# Patient Record
Sex: Male | Born: 1940 | Race: Black or African American | Hispanic: No | Smoking: Former smoker
Health system: Southern US, Community
[De-identification: ages and names within clinical notes are randomized; demographics above are authoritative.]

## PROBLEM LIST (undated history)

## (undated) DIAGNOSIS — I2510 Atherosclerotic heart disease of native coronary artery without angina pectoris: Secondary | ICD-10-CM

## (undated) DIAGNOSIS — I509 Heart failure, unspecified: Secondary | ICD-10-CM

## (undated) DIAGNOSIS — I10 Essential (primary) hypertension: Secondary | ICD-10-CM

## (undated) DIAGNOSIS — E785 Hyperlipidemia, unspecified: Secondary | ICD-10-CM

## (undated) DIAGNOSIS — E119 Type 2 diabetes mellitus without complications: Secondary | ICD-10-CM

## (undated) DIAGNOSIS — N400 Enlarged prostate without lower urinary tract symptoms: Secondary | ICD-10-CM

## (undated) HISTORY — PX: JOINT REPLACEMENT: SHX530

---

## 2016-05-06 ENCOUNTER — Observation Stay
Admission: EM | Admit: 2016-05-06 | Discharge: 2016-05-08 | Disposition: A | Discharge status: Home / Self Care | Payer: Medicare Other | Attending: Internal Medicine | Admitting: Internal Medicine

## 2016-05-06 ENCOUNTER — Encounter: Payer: Self-pay | Admitting: Occupational Medicine

## 2016-05-06 DIAGNOSIS — Z794 Long term (current) use of insulin: Secondary | ICD-10-CM | POA: Diagnosis not present

## 2016-05-06 DIAGNOSIS — W010XXA Fall on same level from slipping, tripping and stumbling without subsequent striking against object, initial encounter: Secondary | ICD-10-CM | POA: Diagnosis not present

## 2016-05-06 DIAGNOSIS — N179 Acute kidney failure, unspecified: Secondary | ICD-10-CM | POA: Diagnosis not present

## 2016-05-06 DIAGNOSIS — Z7982 Long term (current) use of aspirin: Secondary | ICD-10-CM | POA: Insufficient documentation

## 2016-05-06 DIAGNOSIS — Z9114 Patient's other noncompliance with medication regimen: Secondary | ICD-10-CM | POA: Diagnosis not present

## 2016-05-06 DIAGNOSIS — R739 Hyperglycemia, unspecified: Secondary | ICD-10-CM | POA: Diagnosis present

## 2016-05-06 DIAGNOSIS — Z87891 Personal history of nicotine dependence: Secondary | ICD-10-CM | POA: Insufficient documentation

## 2016-05-06 DIAGNOSIS — E1165 Type 2 diabetes mellitus with hyperglycemia: Principal | ICD-10-CM | POA: Insufficient documentation

## 2016-05-06 DIAGNOSIS — I110 Hypertensive heart disease with heart failure: Secondary | ICD-10-CM | POA: Insufficient documentation

## 2016-05-06 DIAGNOSIS — N390 Urinary tract infection, site not specified: Secondary | ICD-10-CM

## 2016-05-06 DIAGNOSIS — E785 Hyperlipidemia, unspecified: Secondary | ICD-10-CM | POA: Diagnosis not present

## 2016-05-06 DIAGNOSIS — Z79899 Other long term (current) drug therapy: Secondary | ICD-10-CM | POA: Insufficient documentation

## 2016-05-06 DIAGNOSIS — N400 Enlarged prostate without lower urinary tract symptoms: Secondary | ICD-10-CM | POA: Diagnosis not present

## 2016-05-06 DIAGNOSIS — R531 Weakness: Secondary | ICD-10-CM | POA: Insufficient documentation

## 2016-05-06 DIAGNOSIS — W19XXXA Unspecified fall, initial encounter: Secondary | ICD-10-CM

## 2016-05-06 DIAGNOSIS — Z833 Family history of diabetes mellitus: Secondary | ICD-10-CM | POA: Insufficient documentation

## 2016-05-06 HISTORY — DX: Benign prostatic hyperplasia without lower urinary tract symptoms: N40.0

## 2016-05-06 HISTORY — DX: Heart failure, unspecified: I50.9

## 2016-05-06 HISTORY — DX: Hyperlipidemia, unspecified: E78.5

## 2016-05-06 HISTORY — DX: Atherosclerotic heart disease of native coronary artery without angina pectoris: I25.10

## 2016-05-06 HISTORY — DX: Essential (primary) hypertension: I10

## 2016-05-06 HISTORY — DX: Type 2 diabetes mellitus without complications: E11.9

## 2016-05-06 LAB — BASIC METABOLIC PANEL
Anion gap: 11 (ref 5–15)
BUN: 31 mg/dL — AB (ref 6–20)
CALCIUM: 9.6 mg/dL (ref 8.9–10.3)
CHLORIDE: 102 mmol/L (ref 101–111)
CO2: 23 mmol/L (ref 22–32)
CREATININE: 1.57 mg/dL — AB (ref 0.61–1.24)
GFR calc Af Amer: 49 mL/min — ABNORMAL LOW (ref >60)
GFR calc non Af Amer: 42 mL/min — ABNORMAL LOW (ref >60)
Glucose, Bld: 630 mg/dL (ref 65–99)
Potassium: 4.3 mmol/L (ref 3.5–5.1)
SODIUM: 136 mmol/L (ref 135–145)

## 2016-05-06 LAB — CBC WITH DIFFERENTIAL/PLATELET
BASOS PCT: 3 %
Basophils Absolute: 0.1 10*3/uL (ref 0–0.1)
EOS ABS: 0 10*3/uL (ref 0–0.7)
EOS PCT: 0 %
HCT: 43.6 % (ref 40.0–52.0)
HEMOGLOBIN: 14.5 g/dL (ref 13.0–18.0)
LYMPHS ABS: 1 10*3/uL (ref 1.0–3.6)
Lymphocytes Relative: 18 %
MCH: 30.6 pg (ref 26.0–34.0)
MCHC: 33.2 g/dL (ref 32.0–36.0)
MCV: 92.1 fL (ref 80.0–100.0)
MONO ABS: 0.4 10*3/uL (ref 0.2–1.0)
MONOS PCT: 7 %
Neutro Abs: 4.1 10*3/uL (ref 1.4–6.5)
Neutrophils Relative %: 72 %
PLATELETS: 158 10*3/uL (ref 150–440)
RBC: 4.73 MIL/uL (ref 4.40–5.90)
RDW: 14.4 % (ref 11.5–14.5)
WBC: 5.7 10*3/uL (ref 3.8–10.6)

## 2016-05-06 LAB — URINALYSIS COMPLETE WITH MICROSCOPIC (ARMC ONLY)
Bilirubin Urine: NEGATIVE
Leukocytes, UA: NEGATIVE
Nitrite: POSITIVE — AB
PROTEIN: NEGATIVE mg/dL
Specific Gravity, Urine: 1.026 (ref 1.005–1.030)
pH: 5 (ref 5.0–8.0)

## 2016-05-06 LAB — GLUCOSE, CAPILLARY
GLUCOSE-CAPILLARY: 450 mg/dL — AB (ref 65–99)
GLUCOSE-CAPILLARY: 565 mg/dL — AB (ref 65–99)

## 2016-05-06 MED ORDER — SODIUM CHLORIDE 0.9 % IV BOLUS (SEPSIS)
1000.0000 mL | Freq: Once | INTRAVENOUS | Status: AC
  Administered 2016-05-06: 1000 mL via INTRAVENOUS

## 2016-05-06 MED ORDER — INSULIN ASPART 100 UNIT/ML ~~LOC~~ SOLN
5.0000 [IU] | Freq: Once | SUBCUTANEOUS | Status: AC
  Administered 2016-05-06: 5 [IU] via INTRAVENOUS
  Filled 2016-05-06: qty 5

## 2016-05-06 MED ORDER — INSULIN ASPART 100 UNIT/ML ~~LOC~~ SOLN
5.0000 [IU] | Freq: Once | SUBCUTANEOUS | Status: AC
  Administered 2016-05-06: 5 [IU] via SUBCUTANEOUS
  Filled 2016-05-06: qty 5

## 2016-05-06 MED ORDER — CEPHALEXIN 500 MG PO CAPS
500.0000 mg | ORAL_CAPSULE | Freq: Once | ORAL | Status: AC
  Administered 2016-05-06: 500 mg via ORAL
  Filled 2016-05-06: qty 1

## 2016-05-06 NOTE — ED Provider Notes (Signed)
[facility] Emergency Department Provider Note   ____________________________________________  Time seen: Seen upon arrival to the emergency department.  I have reviewed the triage vital signs and the nursing notes.   HISTORY  Chief Complaint Hyperglycemia and Fall   HPI Elijah Castro is a 75 y.o. male history of diabetes as well as CHF who is presenting to the emergency department after slip and fall in his bathroom. The patient says that he slipped on his bathroom floor and fell between his tub in his toilet. He became stuck in there and was lodged for about 10 minutes until he was helped out.  Patient denies any head. Denies any pain at this time. Denies any loss of consciousness. He was brought in by EMS for further evaluation after having a glucose read as high. The patient says that he did not take his diabetes medications today and does not know where they are in his home. He has bottles other medications and for review.   Past Medical History  Diagnosis Date  . Diabetes mellitus without complication ([place])   . CHF (congestive heart failure) ([place])   . Coronary artery disease   . Hyperlipidemia   . BPH (benign prostatic hyperplasia)   . Hypertension     There are no active problems to display for this patient.   Past Surgical History  Procedure Laterality Date  . Joint replacement      No current outpatient prescriptions on file.  Allergies Review of patient's allergies indicates no known allergies.  No family history on file.  Social History Social History  Substance Use Topics  . Smoking status: Former Research scientist (life sciences)  . Smokeless tobacco: None  . Alcohol Use: No    Review of Systems Constitutional: No fever/chills Eyes: No visual changes. ENT: No sore throat. Cardiovascular: Denies chest pain. Respiratory: Denies shortness of breath. Gastrointestinal: No abdominal pain.  No nausea, no vomiting.  No diarrhea.  No  constipation. Genitourinary: Negative for dysuria. Musculoskeletal: Negative for back pain. Skin: Negative for rash. Neurological: Negative for headaches, focal weakness or numbness.  10-point ROS otherwise negative.  ____________________________________________   PHYSICAL EXAM:  VITAL SIGNS: ED Triage Vitals  Enc Vitals Group     BP 05/06/16 2035 158/90 mmHg     Pulse Rate 05/06/16 2035 100     Resp 05/06/16 2035 24     Temp 05/06/16 2035 99.9 F (37.7 C)     Temp Source 05/06/16 2035 Oral     SpO2 05/06/16 2030 96 %     Weight 05/06/16 2035 250 lb (113.399 kg)     Height 05/06/16 2035 5\' 10"  (1.778 m)     Head Cir --      Peak Flow --      Pain Score --      Pain Loc --      Pain Edu? --      Excl. in [place]? --     Constitutional: Alert and oriented. Well appearing and in no acute distress. Eyes: Conjunctivae are normal. PERRL. EOMI. Head: Atraumatic. Nose: No congestion/rhinnorhea. Mouth/Throat: Mucous membranes are moist.   Neck: No stridor.  No tenderness to palpation of the midline C-spine. Ranges neck freely without any pain or restriction. Cardiovascular: Normal rate, regular rhythm. Grossly normal heart sounds.  Good peripheral circulation. Respiratory: Normal respiratory effort.  No retractions. Lungs CTAB. Gastrointestinal: Soft and nontender. No distention. no CVA tenderness. Musculoskeletal: No lower extremity tenderness nor edema.  No joint effusions. 5 out  of 5 strength bilateral lower extremities. No tenderness palpation to the pelvis or the hips. No deformity noted. The raccoon lumbar spines to the midline and laterally are nontender. No step-off or deformity. Neurologic:  Normal speech and language. No gross focal neurologic deficits are appreciated. No gait instability. Skin:  Skin is warm, dry and intact. No rash noted. No bruising noted. Psychiatric: Mood and affect are normal. Speech and behavior are  normal.  ____________________________________________   LABS (all labs ordered are listed, but only abnormal results are displayed)  Labs Reviewed  BASIC METABOLIC PANEL - Abnormal; Notable for the following:    Glucose, Bld 630 (*)    BUN 31 (*)    Creatinine, Ser 1.57 (*)    GFR calc non Af Amer 42 (*)    GFR calc Af Amer 49 (*)    All other components within normal limits  URINALYSIS COMPLETEWITH MICROSCOPIC ([facility] ONLY) - Abnormal; Notable for the following:    Color, Urine YELLOW (*)    APPearance HAZY (*)    Glucose, UA >500 (*)    Ketones, ur 1+ (*)    Hgb urine dipstick 1+ (*)    Nitrite POSITIVE (*)    Bacteria, UA RARE (*)    Squamous Epithelial / LPF 0-5 (*)    All other components within normal limits  GLUCOSE, CAPILLARY - Abnormal; Notable for the following:    Glucose-Capillary 565 (*)    All other components within normal limits  GLUCOSE, CAPILLARY - Abnormal; Notable for the following:    Glucose-Capillary >600 (*)    All other components within normal limits  GLUCOSE, CAPILLARY - Abnormal; Notable for the following:    Glucose-Capillary 450 (*)    All other components within normal limits  URINE CULTURE  CBC WITH DIFFERENTIAL/PLATELET   ____________________________________________  EKG  ED ECG REPORT I, Doran Stabler, the attending physician, personally viewed and interpreted this ECG.   Date: 05/06/2016  EKG Time: 2034  Rate: 99  Rhythm: normal sinus rhythm  Axis: Normal  Intervals:none  ST&T Change: No ST segment elevation or depression. No abnormal T-wave inversion.  ____________________________________________  RADIOLOGY   ____________________________________________   PROCEDURES   Procedures   ____________________________________________   INITIAL IMPRESSION / ASSESSMENT AND PLAN / ED COURSE  Pertinent labs & imaging results that were available during my care of the patient were reviewed by me and considered in my  medical decision making (see chart for details).  ----------------------------------------- 11:37 PM on 05/06/2016 -----------------------------------------  Patient after initial fluids and an elevation in his blood sugar. We'll give second liter fluids as well as intravenous insulin as well as subcutaneous insulin. We'll have reevaluation after these doses of his insulin. Signed out to Dr. Jerl Santos. ____________________________________________   FINAL CLINICAL IMPRESSION(S) / ED DIAGNOSES  Mechanical fall. Hyperglycemia. UTI.    NEW MEDICATIONS STARTED DURING THIS VISIT:  New Prescriptions   No medications on file     Note:  This document was prepared using Dragon voice recognition software and may include unintentional dictation errors.    Orbie Pyo, MD 05/06/16 [phone]

## 2016-05-06 NOTE — ED Notes (Signed)
Patient presents to Emergency Department via EMS with complaints of FALL STUCK B/W TOLIET AND TUB. FIRE DEPT HELPED UP. PT DM EMS REPORTED BS HIGH. PT LIVES BY SELF.  PT DENIES LOC. PT REMEMBERS FALLING LOST BALANCE. DENIES ANY PAIN.

## 2016-05-07 ENCOUNTER — Encounter: Payer: Self-pay | Admitting: Internal Medicine

## 2016-05-07 DIAGNOSIS — R739 Hyperglycemia, unspecified: Secondary | ICD-10-CM | POA: Diagnosis present

## 2016-05-07 LAB — GLUCOSE, CAPILLARY
Glucose-Capillary: 518 mg/dL (ref 65–99)
Glucose-Capillary: 295 mg/dL — ABNORMAL HIGH (ref 65–99)
Glucose-Capillary: 268 mg/dL — ABNORMAL HIGH (ref 65–99)
Glucose-Capillary: 282 mg/dL — ABNORMAL HIGH (ref 65–99)
Glucose-Capillary: 246 mg/dL — ABNORMAL HIGH (ref 65–99)
Glucose-Capillary: 262 mg/dL — ABNORMAL HIGH (ref 65–99)

## 2016-05-07 LAB — TSH: TSH: 0.784 u[IU]/mL (ref 0.350–4.500)

## 2016-05-07 LAB — HEMOGLOBIN A1C: Hgb A1c MFr Bld: 12.6 % — ABNORMAL HIGH (ref 4.0–6.0)

## 2016-05-07 MED ORDER — ENOXAPARIN SODIUM 40 MG/0.4ML ~~LOC~~ SOLN
40.0000 mg | SUBCUTANEOUS | Status: DC
  Administered 2016-05-07 – 2016-05-08 (×2): 40 mg via SUBCUTANEOUS
  Filled 2016-05-07 (×2): qty 0.4

## 2016-05-07 MED ORDER — FINASTERIDE 5 MG PO TABS
5.0000 mg | ORAL_TABLET | Freq: Every day | ORAL | Status: DC
  Administered 2016-05-07 – 2016-05-08 (×2): 5 mg via ORAL
  Filled 2016-05-07 (×2): qty 1

## 2016-05-07 MED ORDER — ACETAMINOPHEN 650 MG RE SUPP: 650.0000 mg | Freq: Four times a day (QID) | RECTAL | Status: DC | PRN

## 2016-05-07 MED ORDER — MORPHINE SULFATE (PF) 2 MG/ML IV SOLN: 2.0000 mg | INTRAVENOUS | Status: DC | PRN

## 2016-05-07 MED ORDER — DEXTROSE 5 % IV SOLN
1.0000 g | INTRAVENOUS | Status: DC
  Administered 2016-05-07 – 2016-05-08 (×2): 1 g via INTRAVENOUS
  Filled 2016-05-07 (×3): qty 10

## 2016-05-07 MED ORDER — ONDANSETRON HCL 4 MG PO TABS: 4.0000 mg | ORAL_TABLET | Freq: Four times a day (QID) | ORAL | Status: DC | PRN

## 2016-05-07 MED ORDER — INSULIN ASPART 100 UNIT/ML ~~LOC~~ SOLN
5.0000 [IU] | Freq: Once | SUBCUTANEOUS | Status: AC
  Administered 2016-05-07: 5 [IU] via INTRAVENOUS
  Filled 2016-05-07: qty 5

## 2016-05-07 MED ORDER — INSULIN ASPART 100 UNIT/ML ~~LOC~~ SOLN
10.0000 [IU] | Freq: Once | SUBCUTANEOUS | Status: AC
  Administered 2016-05-07: 10 [IU] via INTRAVENOUS
  Filled 2016-05-07: qty 10

## 2016-05-07 MED ORDER — ONDANSETRON HCL 4 MG/2ML IJ SOLN: 4.0000 mg | Freq: Four times a day (QID) | INTRAMUSCULAR | Status: DC | PRN

## 2016-05-07 MED ORDER — SODIUM CHLORIDE 0.9 % IV SOLN
INTRAVENOUS | Status: DC
  Administered 2016-05-07: 04:00:00 via INTRAVENOUS

## 2016-05-07 MED ORDER — LISINOPRIL 20 MG PO TABS
20.0000 mg | ORAL_TABLET | Freq: Every day | ORAL | Status: DC
  Administered 2016-05-07 – 2016-05-08 (×2): 20 mg via ORAL
  Filled 2016-05-07 (×2): qty 1

## 2016-05-07 MED ORDER — INSULIN ASPART 100 UNIT/ML ~~LOC~~ SOLN
0.0000 [IU] | Freq: Three times a day (TID) | SUBCUTANEOUS | Status: DC
  Administered 2016-05-07 (×3): 8 [IU] via SUBCUTANEOUS
  Administered 2016-05-07: 5 [IU] via SUBCUTANEOUS
  Administered 2016-05-08 (×2): 8 [IU] via SUBCUTANEOUS
  Filled 2016-05-07 (×4): qty 8
  Filled 2016-05-07: qty 5
  Filled 2016-05-07: qty 8

## 2016-05-07 MED ORDER — METOPROLOL TARTRATE 25 MG PO TABS
12.5000 mg | ORAL_TABLET | Freq: Two times a day (BID) | ORAL | Status: DC
  Administered 2016-05-07 – 2016-05-08 (×3): 12.5 mg via ORAL
  Filled 2016-05-07 (×3): qty 1

## 2016-05-07 MED ORDER — ACETAMINOPHEN 325 MG PO TABS: 650.0000 mg | ORAL_TABLET | Freq: Four times a day (QID) | ORAL | Status: DC | PRN

## 2016-05-07 MED ORDER — DOCUSATE SODIUM 100 MG PO CAPS
100.0000 mg | ORAL_CAPSULE | Freq: Two times a day (BID) | ORAL | Status: DC
  Administered 2016-05-07 – 2016-05-08 (×3): 100 mg via ORAL
  Filled 2016-05-07 (×3): qty 1

## 2016-05-07 MED ORDER — TERAZOSIN HCL 2 MG PO CAPS
2.0000 mg | ORAL_CAPSULE | Freq: Every day | ORAL | Status: DC
  Administered 2016-05-07: 2 mg via ORAL
  Filled 2016-05-07: qty 1

## 2016-05-07 MED ORDER — ATORVASTATIN CALCIUM 20 MG PO TABS
40.0000 mg | ORAL_TABLET | Freq: Every day | ORAL | Status: DC
  Administered 2016-05-07: 40 mg via ORAL
  Filled 2016-05-07: qty 2

## 2016-05-07 MED ORDER — INSULIN GLARGINE 100 UNIT/ML ~~LOC~~ SOLN
12.0000 [IU] | Freq: Every day | SUBCUTANEOUS | Status: DC
  Administered 2016-05-07: 12 [IU] via SUBCUTANEOUS
  Filled 2016-05-07 (×2): qty 0.12

## 2016-05-07 MED ORDER — ASPIRIN 81 MG PO CHEW
81.0000 mg | CHEWABLE_TABLET | Freq: Every day | ORAL | Status: DC
  Administered 2016-05-07 – 2016-05-08 (×2): 81 mg via ORAL
  Filled 2016-05-07 (×2): qty 1

## 2016-05-07 NOTE — ED Notes (Signed)
Awaiting a bed.

## 2016-05-07 NOTE — ED Notes (Signed)
Pt awaiting to recheck CBG 1 hour after insulin.

## 2016-05-07 NOTE — ED Notes (Signed)
Dr. Marcille Blanco made aware CBG down to 295 after insulin

## 2016-05-07 NOTE — H&P (Signed)
Elijah Castro is an 75 y.o. male.   Chief Complaint: Fall HPI: The patient with past medical history significant for diabetes presents emergency department after a fall. Apparently he had a mechanical fall in the bathroom and was stuck between the commode and wall. He denies hitting his head or having any pain in his extremities. He also denies chest pain, shortness of breath, nausea, vomiting or diarrhea. Laboratory evaluation in the emergency department showed significant hyperglycemia. After multiple doses of IV and subcutaneous insulin as well as 2 L of fluid the patient's blood sugar remained elevated which prompted the emergency department staff to call for admission.  Past Medical History  Diagnosis Date  . Diabetes mellitus without complication ([place])   . CHF (congestive heart failure) ([place])   . Coronary artery disease   . Hyperlipidemia   . BPH (benign prostatic hyperplasia)   . Hypertension     Past Surgical History  Procedure Laterality Date  . Joint replacement      Family History  Problem Relation Age of Onset  . Diabetes Mellitus II Daughter    Social History:  reports that he has quit smoking. He does not have any smokeless tobacco history on file. He reports that he does not drink alcohol or use illicit drugs.  Allergies: No Known Allergies  Prior to Admission medications   Medication Sig Start Date End Date Taking? Authorizing Provider  aspirin 81 MG chewable tablet Chew 81 mg by mouth daily.   Yes Historical Provider, MD  atorvastatin (LIPITOR) 80 MG tablet Take 40 mg by mouth at bedtime.   Yes Historical Provider, MD  finasteride (PROSCAR) 5 MG tablet Take 5 mg by mouth daily.   Yes Historical Provider, MD  lisinopril (PRINIVIL,ZESTRIL) 40 MG tablet Take 20 mg by mouth daily.   Yes Historical Provider, MD  metoprolol tartrate (LOPRESSOR) 25 MG tablet Take 12.5 mg by mouth 2 (two) times daily.   Yes Historical Provider, MD  terazosin (HYTRIN) 2 MG capsule Take 2 mg  by mouth at bedtime.   Yes Historical Provider, MD     Results for orders placed or performed during the hospital encounter of 05/06/16 (from the past 48 hour(s))  Glucose, capillary     Status: Abnormal   Collection Time: 05/06/16  8:56 PM  Result Value Ref Range   Glucose-Capillary 565 (HH) 65 - 99 mg/dL   Comment 1 Document in Chart   CBC with Differential     Status: None   Collection Time: 05/06/16  8:58 PM  Result Value Ref Range   WBC 5.7 3.8 - 10.6 K/uL   RBC 4.73 4.40 - 5.90 MIL/uL   Hemoglobin 14.5 13.0 - 18.0 g/dL   HCT 43.6 40.0 - 52.0 %   MCV 92.1 80.0 - 100.0 fL   MCH 30.6 26.0 - 34.0 pg   MCHC 33.2 32.0 - 36.0 g/dL   RDW 14.4 11.5 - 14.5 %   Platelets 158 150 - 440 K/uL   Neutrophils Relative % 72 %   Neutro Abs 4.1 1.4 - 6.5 K/uL   Lymphocytes Relative 18 %   Lymphs Abs 1.0 1.0 - 3.6 K/uL   Monocytes Relative 7 %   Monocytes Absolute 0.4 0.2 - 1.0 K/uL   Eosinophils Relative 0 %   Eosinophils Absolute 0.0 0 - 0.7 K/uL   Basophils Relative 3 %   Basophils Absolute 0.1 0 - 0.1 K/uL  Basic metabolic panel     Status: Abnormal   Collection  Time: 05/06/16  8:58 PM  Result Value Ref Range   Sodium 136 135 - 145 mmol/L   Potassium 4.3 3.5 - 5.1 mmol/L   Chloride 102 101 - 111 mmol/L   CO2 23 22 - 32 mmol/L   Glucose, Bld 630 (HH) 65 - 99 mg/dL    Comment: CRITICAL RESULT CALLED TO, READ BACK BY AND VERIFIED WITH:  [facility] BROWN AT 2233 05/06/16 SDR    BUN 31 (H) 6 - 20 mg/dL   Creatinine, Ser 1.57 (H) 0.61 - 1.24 mg/dL   Calcium 9.6 8.9 - 10.3 mg/dL   GFR calc non Af Amer 42 (L) >60 mL/min   GFR calc Af Amer 49 (L) >60 mL/min    Comment: (NOTE) The eGFR has been calculated using the CKD EPI equation. This calculation has not been validated in all clinical situations. eGFR's persistently <60 mL/min signify possible Chronic Kidney Disease.    Anion gap 11 5 - 15  Glucose, capillary     Status: Abnormal   Collection Time: 05/06/16  9:56 PM  Result Value Ref  Range   Glucose-Capillary >600 (HH) 65 - 99 mg/dL  Urinalysis complete, with microscopic ([facility] only)     Status: Abnormal   Collection Time: 05/06/16 10:38 PM  Result Value Ref Range   Color, Urine YELLOW (A) YELLOW   APPearance HAZY (A) CLEAR   Glucose, UA >500 (A) NEGATIVE mg/dL   Bilirubin Urine NEGATIVE NEGATIVE   Ketones, ur 1+ (A) NEGATIVE mg/dL   Specific Gravity, Urine 1.026 1.005 - 1.030   Hgb urine dipstick 1+ (A) NEGATIVE   pH 5.0 5.0 - 8.0   Protein, ur NEGATIVE NEGATIVE mg/dL   Nitrite POSITIVE (A) NEGATIVE   Leukocytes, UA NEGATIVE NEGATIVE   RBC / HPF 0-5 0 - 5 RBC/hpf   WBC, UA 0-5 0 - 5 WBC/hpf   Bacteria, UA RARE (A) NONE SEEN   Squamous Epithelial / LPF 0-5 (A) NONE SEEN   Mucous PRESENT   Glucose, capillary     Status: Abnormal   Collection Time: 05/06/16 11:33 PM  Result Value Ref Range   Glucose-Capillary 450 (H) 65 - 99 mg/dL   Comment 1 Call MD NNP PA CNM   Glucose, capillary     Status: Abnormal   Collection Time: 05/07/16  1:31 AM  Result Value Ref Range   Glucose-Capillary 518 (HH) 65 - 99 mg/dL   No results found.  Review of Systems  Constitutional: Negative for fever and chills.  HENT: Negative for sore throat and tinnitus.   Eyes: Negative for blurred vision and redness.  Respiratory: Negative for cough and shortness of breath.   Cardiovascular: Negative for chest pain, palpitations, orthopnea and PND.  Gastrointestinal: Negative for nausea, vomiting, abdominal pain and diarrhea.  Genitourinary: Positive for frequency. Negative for dysuria.       Hesitancy  Musculoskeletal: Negative for myalgias and joint pain.  Skin: Negative for rash.       No lesions  Neurological: Negative for speech change, focal weakness and weakness.  Endo/Heme/Allergies: Does not bruise/bleed easily.       No temperature intolerance  Psychiatric/Behavioral: Negative for depression and suicidal ideas.    Blood pressure 143/77, pulse 77, temperature 99.9 F (37.7  C), temperature source Oral, resp. rate 20, height 5' 10"  (1.778 m), weight 113.399 kg (250 lb), SpO2 97 %. Physical Exam  Nursing note and vitals reviewed. Constitutional: He is oriented to person, place, and time. He appears well-developed and well-nourished.  No distress.  HENT:  Head: Normocephalic and atraumatic.  Mouth/Throat: Oropharynx is clear and moist.  Eyes: Conjunctivae and EOM are normal. Pupils are equal, round, and reactive to light. No scleral icterus.  Neck: Normal range of motion. Neck supple. No JVD present. No tracheal deviation present. No thyromegaly present.  Cardiovascular: Normal rate, regular rhythm and normal heart sounds.  Exam reveals no gallop and no friction rub.   No murmur heard. Respiratory: Effort normal and breath sounds normal. No respiratory distress. He has no wheezes.  GI: Soft. Bowel sounds are normal. He exhibits distension. He exhibits no mass. There is no tenderness. There is no rebound and no guarding.  Genitourinary:  Deferred  Musculoskeletal: Normal range of motion. He exhibits no edema.  Lymphadenopathy:    He has no cervical adenopathy.  Neurological: He is alert and oriented to person, place, and time. No cranial nerve deficit.  Skin: Skin is warm and dry. No rash noted. No erythema.  Psychiatric: He has a normal mood and affect. His behavior is normal. Judgment and thought content normal.     Assessment/Plan This is a 75 year old male admitted for hyperglycemia due to uncontrolled diabetes. 1. Diabetes mellitus type 2: With hyperglycemia; no ketoacidosis. I have placed the patient on basal insulin as well as sliding scale with meals to tighten glycemic control. Check hemoglobin A1c. ADA diet. 2. Urinary tract infection: Present on admission; the patient is symptomatic. Ceftriaxone 1. Consider fluoroquinolone for outpatient treatment as the patient also has BPH. 3. Essential hypertension: Uncontrolled; resume metoprolol and lisinopril.  The patient admits that he does not take his medications as he should.  4. Heart disease: Unclear if the patient has coronary artery disease or nonischemic cardiomyopathy. He has symptoms of neither at this time. Obtain records from [facility]. Continue aspirin 5. Hyperlipidemia: Continue statin therapy 6. BPH: The patient has not been taking his medication and thus complains of urinary hesitancy throughout the night. Resume terazosin and finasteride 7. DVT prophylaxis: Lovenox 8. GI prophylaxis: None as the patient is not critically ill 9. Disposition: Case management to arrange follow-up with the [place] The patient is a full code. Time spent on admission orders and patient care approximately 45 minutes  Harrie Foreman, MD 05/07/2016, 2:58 AM

## 2016-05-07 NOTE — ED Notes (Signed)
Admiting MD at the bedside.

## 2016-05-07 NOTE — Discharge Planning (Addendum)
**  Inputted on wrong patient . Please disregard note.  Pt IV removed. DC papers given, explained and educated.  Pt told of suggested FU appts, given scripts and also informed of meds sent to pharm.  RN assessment and VS revealed stability for DC to home.  Pt will be walked to front and family transporting home via car.

## 2016-05-07 NOTE — Care Management (Signed)
[facility] notified of patient admission. Patient is OBS status- [facility] will not [facility] in outpatient status. RNCM will continue to follow.

## 2016-05-07 NOTE — Care Management Obs Status (Addendum)
[place] NOTIFICATION   Patient Details  Name: Elijah Castro MRN: [id number] Date of Birth: [birth year]   Medicare Observation Status Notification Given:  Yes  [place], [place] [phone]   Spoke with patient's son.  Marshell Garfinkel, RN 05/07/2016, 8:34 AM

## 2016-05-07 NOTE — Care Management Note (Addendum)
Case Management Note  Patient Details  Name: Jashua Wardrop MRN: [id number] Date of Birth: 12-23-1940  Subjective/Objective:                   Spoke with patient and he is somewhat forgetful. I also spoke with patient's DIL and POA son L[place]on over the phone. I explained the MOON form that I left at patient's bedside. They state patient's PCP is with [facility]. They would like home health Nurse to go out to assist with medication needs and training- their technique is not working and patient is not taking meds correctly. They would like to use [facility]- MD please add Social Worker to this discharge plan. Patient lives alone and independent with daily activities however he is very forgetful and not taking home meds correctly.  Action/Plan:   List of home health referrals provided over the phone. Referral faxed to Santiago Glad with [facility]. RNCM to fax discharge information to [facility].   Expected Discharge Date:                  Expected Discharge Plan:     In-House Referral:     Discharge planning Services     Post Acute Care Choice:  Home Health Choice offered to:  Patient, Adult Children  DME Arranged:    DME Agency:     HH Arranged:  RN, Social Work [employer] Agency:  [place]  Status of Service:  In process, will continue to follow  If discussed at Long Length of Stay Meetings, dates discussed:    Additional Comments: PCP Judithann Sheen, RN 05/07/2016, 9:18 AM

## 2016-05-07 NOTE — Progress Notes (Signed)
[place] [facility] [place] NAME: Elijah Castro    MR#:  [id number]  DATE OF BIRTH:  07-29-1941  SUBJECTIVE:  CHIEF COMPLAINT:   Chief Complaint  Patient presents with  . Hyperglycemia  . Fall    Came with a fall- found to have severe hyperglycemia.    He agreed that he was not very compliant with his insulin.  REVIEW OF SYSTEMS:  CONSTITUTIONAL: No fever, positive for fatigue or weakness.  EYES: No blurred or double vision.  EARS, NOSE, AND THROAT: No tinnitus or ear pain.  RESPIRATORY: No cough, shortness of breath, wheezing or hemoptysis.  CARDIOVASCULAR: No chest pain, orthopnea, edema.  GASTROINTESTINAL: No nausea, vomiting, diarrhea or abdominal pain.  GENITOURINARY: No dysuria, hematuria.  ENDOCRINE: No polyuria, nocturia,  HEMATOLOGY: No anemia, easy bruising or bleeding SKIN: No rash or lesion. MUSCULOSKELETAL: No joint pain or arthritis.   NEUROLOGIC: No tingling, numbness, weakness.  PSYCHIATRY: No anxiety or depression.   ROS  DRUG ALLERGIES:  No Known Allergies  VITALS:  Blood pressure 122/74, pulse 74, temperature 98.4 F (36.9 C), temperature source Oral, resp. rate 18, height 5\' 10"  (1.778 m), weight 109.634 kg (241 lb 11.2 oz), SpO2 98 %.  PHYSICAL EXAMINATION:  GENERAL:  75 y.o.-year-old patient lying in the bed with no acute distress.  EYES: Pupils equal, round, reactive to light and accommodation. No scleral icterus. Extraocular muscles intact.  HEENT: Head atraumatic, normocephalic. Oropharynx and nasopharynx clear.  NECK:  Supple, no jugular venous distention. No thyroid enlargement, no tenderness.  LUNGS: Normal breath sounds bilaterally, no wheezing, rales,rhonchi or crepitation. No use of accessory muscles of respiration.  CARDIOVASCULAR: S1, S2 normal. No murmurs, rubs, or gallops.  ABDOMEN: Soft, nontender, nondistended. Bowel sounds present. No organomegaly or mass.  EXTREMITIES: No pedal edema, cyanosis,  or clubbing.  NEUROLOGIC: Cranial nerves II through XII are intact. Muscle strength 4/5 in all extremities. Sensation intact. Gait not checked.  PSYCHIATRIC: The patient is alert and oriented x 3.  SKIN: No obvious rash, lesion, or ulcer.   Physical Exam LABORATORY PANEL:   CBC  Recent Labs Lab 05/06/16 2058  WBC 5.7  HGB 14.5  HCT 43.6  PLT 158   ------------------------------------------------------------------------------------------------------------------  Chemistries   Recent Labs Lab 05/06/16 2058  NA 136  K 4.3  CL 102  CO2 23  GLUCOSE 630*  BUN 31*  CREATININE 1.57*  CALCIUM 9.6   ------------------------------------------------------------------------------------------------------------------  Cardiac Enzymes No results for input(s): TROPONINI in the last 168 hours. ------------------------------------------------------------------------------------------------------------------  RADIOLOGY:  No results found.  ASSESSMENT AND PLAN:   Active Problems:   Hyperglycemia  1. Diabetes mellitus type 2: With hyperglycemia; no ketoacidosis.  on basal insulin as well as sliding scale with meals to tighten glycemic control. Check hemoglobin A1c. ADA diet.   He told- he was suppose to take 35 units insulin- but was not taking it. 2. Urinary tract infection: Present on admission; the patient is symptomatic.   Nuitrates positive, 5 WBcs. Cx sent. 3. Essential hypertension: Uncontrolled; resume metoprolol and lisinopril. The patient admits that he does not take his medications as he should. BP normal now. 4. Heart disease: Unclear if the patient has coronary artery disease or nonischemic cardiomyopathy. He has symptoms of neither at this time. Obtain records from [facility]. Continue aspirin 5. Hyperlipidemia: Continue statin therapy 6. BPH: The patient has not been taking his medication and thus complains of urinary hesitancy throughout the night.  Resume terazosin and finasteride  7. DVT prophylaxis: Lovenox 8. GI prophylaxis: None as the patient is not critically ill 9. Disposition: Get PT eval.   All the records are reviewed and case discussed with Care Management/Social Workerr. Management plans discussed with the patient, family and they are in agreement.  CODE STATUS: Full.  TOTAL TIME TAKING CARE OF THIS PATIENT: 35 minutes.   POSSIBLE D/C IN 1-2 DAYS, DEPENDING ON CLINICAL CONDITION.   Vaughan Basta M.D on 05/07/2016   Between 7am to 6pm - Pager - [phone]  After 6pm go to [email or web address] - password [email or web address]  Office  [phone]  CC: Primary care physician; No primary care provider on file.  Note: This dictation was prepared with Dragon dictation along with smaller phrase technology. Any transcriptional errors that result from this process are unintentional.

## 2016-05-08 LAB — BASIC METABOLIC PANEL
Anion gap: 6 (ref 5–15)
BUN: 18 mg/dL (ref 6–20)
CO2: 28 mmol/L (ref 22–32)
CREATININE: 1.04 mg/dL (ref 0.61–1.24)
Calcium: 8.9 mg/dL (ref 8.9–10.3)
Chloride: 105 mmol/L (ref 101–111)
GFR calc Af Amer: >60 mL/min (ref >60)
Glucose, Bld: 221 mg/dL — ABNORMAL HIGH (ref 65–99)
Potassium: 4.4 mmol/L (ref 3.5–5.1)
SODIUM: 139 mmol/L (ref 135–145)

## 2016-05-08 LAB — URINE CULTURE

## 2016-05-08 LAB — GLUCOSE, CAPILLARY
Glucose-Capillary: 278 mg/dL — ABNORMAL HIGH (ref 65–99)
Glucose-Capillary: 276 mg/dL — ABNORMAL HIGH (ref 65–99)

## 2016-05-08 MED ORDER — INSULIN GLARGINE 100 UNIT/ML ~~LOC~~ SOLN
20.0000 [IU] | Freq: Every day | SUBCUTANEOUS | Status: DC
  Administered 2016-05-08: 20 [IU] via SUBCUTANEOUS
  Filled 2016-05-08 (×2): qty 0.2

## 2016-05-08 MED ORDER — CEFUROXIME AXETIL 250 MG PO TABS: 250.0000 mg | ORAL_TABLET | Freq: Two times a day (BID) | ORAL | Status: DC

## 2016-05-08 MED ORDER — INSULIN GLARGINE 100 UNIT/ML ~~LOC~~ SOLN: 20.0000 [IU] | Freq: Every day | SUBCUTANEOUS | Status: DC

## 2016-05-08 NOTE — Evaluation (Signed)
Physical Therapy Evaluation Patient Details Name: Elijah Castro MRN: [id number] DOB: [birth year] Today's Date: 05/08/2016   History of Present Illness  Pt is a 75 y/o male that presents with fall in bathroom. He is noted to be forgetful and has not been keeping up with his insluin/glucose regulation as prescribed, noted to have significant hyperglycemia upon admission, now under better control.   Clinical Impression  Patient is a pleasant 75 y/o that presents from home where he has lived independently, he is a poor historian but it appears he has been using a RW in the home. Per notes and this encounter he seems to be forgetful, has been noted to forget to take his insulin at home. He presents today with mild decline in bed mobility requiring min A x1, however he is able to stand on his own using his UEs primarily due to LE weakness. Once ambulating he is able to perform gait with RW, he is easily distracted but has no other gait abnormalities. He appears close to his physical baseline, though HHPT eval for home safety set up given his cognitive status is warranted. Of concern is his cognitive status given that he lives alone, 24 hour assistance may be indicated.     Follow Up Recommendations Home health PT    Equipment Recommendations  Rolling walker with 5" wheels    Recommendations for Other Services       Precautions / Restrictions Precautions Precautions: Fall Restrictions Weight Bearing Restrictions: No      Mobility  Bed Mobility Overal bed mobility: Needs Assistance Bed Mobility: Supine to Sit     Supine to sit: Min assist;HOB elevated     General bed mobility comments: Patient requires assistance to elevate trunk off surface, no assistance required for LEs.   Transfers Overall transfer level: Needs assistance Equipment used: Rolling walker (2 wheeled) Transfers: Sit to/from Stand Sit to Stand: Min guard         General transfer comment: Heavy use of UEs, though  able to complete without physical assistance.    Ambulation/Gait Ambulation/Gait assistance: Min guard Ambulation Distance (Feet): 200 Feet Assistive device: Rolling walker (2 wheeled) Gait Pattern/deviations: WFL(Within Functional Limits)   Gait velocity interpretation: Below normal speed for age/gender General Gait Details: Step through gait pattern, no loss of balance noted. Modified DGI of 10/12 with RW. Noted to have poor attention span and is easily distracted causing him to run RW into walls on occasion.   Stairs            Wheelchair Mobility    Modified Rankin (Stroke Patients Only)       Balance Overall balance assessment: Needs assistance Sitting-balance support: Bilateral upper extremity supported Sitting balance-Leahy Scale: Good     Standing balance support: Bilateral upper extremity supported Standing balance-Leahy Scale: Fair Standing balance comment: 5x sit to stand - 18 seconds with heavy use of UEs, modified DGI of 10/12                              Pertinent Vitals/Pain Pain Assessment: No/denies pain    Home Living Family/patient expects to be discharged to:: Private residence Living Arrangements: Alone Available Help at Discharge: Family;Available PRN/intermittently Type of Home: House Home Access: Stairs to enter   Entrance Stairs-Number of Steps: 1 Home Layout: One level Home Equipment: Walker - 2 wheels      Prior Function Level of Independence: Independent with assistive device(s)  Comments: Patient is not a reliable historian, however it appears he has been independent with use of RW.     Hand Dominance        Extremity/Trunk Assessment   Upper Extremity Assessment: Overall WFL for tasks assessed           Lower Extremity Assessment: Overall WFL for tasks assessed         Communication   Communication: No difficulties  Cognition Arousal/Alertness: Awake/alert Behavior During Therapy: WFL for  tasks assessed/performed Overall Cognitive Status: Within Functional Limits for tasks assessed       Memory: Decreased short-term memory (Appears to have short term memory loss/decreased recall (appears to be his baseline). )              General Comments      Exercises        Assessment/Plan    PT Assessment Patient needs continued PT services  PT Diagnosis Altered mental status;Difficulty walking   PT Problem List Decreased strength;Decreased balance;Decreased mobility;Decreased cognition;Decreased safety awareness;Decreased knowledge of use of DME  PT Treatment Interventions DME instruction;Gait training;Stair training;Therapeutic activities;Therapeutic exercise;Balance training   PT Goals (Current goals can be found in the Care Plan section) Acute Rehab PT Goals Patient Stated Goal: To return home  PT Goal Formulation: With patient Time For Goal Achievement: 05/22/16 Potential to Achieve Goals: Good    Frequency Min 2X/week   Barriers to discharge Decreased caregiver support Patient lives at home alone per notes. He has apparently had difficulty keeping up with his medication regimen.     Co-evaluation               End of Session Equipment Utilized During Treatment: Gait belt Activity Tolerance: Patient tolerated treatment well Patient left: in chair;with call bell/phone within reach;with chair alarm set Nurse Communication: Mobility status    Functional Assessment Tool Used: 5x sit to stand, modified DGI  Functional Limitation: Mobility: Walking and moving around Mobility: Walking and Moving Around Current Status [id number]): At least 1 percent but less than 20 percent impaired, limited or restricted Mobility: Walking and Moving Around Goal Status [phone]): At least 1 percent but less than 20 percent impaired, limited or restricted    Time: 0911-0924 PT Time Calculation (min) (ACUTE ONLY): 13 min   Charges:   PT Evaluation $PT Eval Moderate  Complexity: 1 Procedure     PT G Codes:   PT G-Codes **NOT FOR INPATIENT CLASS** Functional Assessment Tool Used: 5x sit to stand, modified DGI  Functional Limitation: Mobility: Walking and moving around Mobility: Walking and Moving Around Current Status [id number]): At least 1 percent but less than 20 percent impaired, limited or restricted Mobility: Walking and Moving Around Goal Status [phone]): At least 1 percent but less than 20 percent impaired, limited or restricted   Kerman Passey, PT, DPT    05/08/2016, 2:30 PM

## 2016-05-08 NOTE — Care Management (Addendum)
Spoke with daughter Neoma Laming at the bedside. Patient will be discharged today to home with Home health.  Referral placed with Tresea Mall at  [facility] at family request.  Patient will be opened for PT RN and CSW to assist with medication management . Tresea Mall to room to discuss [facility] with daughter and patient. Case discussed with attending as well.  Daughter is making arrangements for additional family support at home. DME Walker ordered through [facility]. Discharge summary faxed to [facility] at [facility]. PCP is Dr Desiree Hane, and CM is Nils Pyle  [phone]  X [phone].

## 2016-05-08 NOTE — Progress Notes (Signed)
Patient's daughter called to clarify Lantus dose and next time it is due.  Reviewed with daughter that this RN gave the Lantus dose and she is suppose to start tomorrow night and need to keep taking blood sugars before meals.

## 2016-05-08 NOTE — Discharge Summary (Signed)
[place] at [place] NAME: Elijah Castro    MR#:  [id number]  DATE OF BIRTH:  [birth year]  DATE OF ADMISSION:  05/06/2016 ADMITTING PHYSICIAN: Harrie Foreman, MD  DATE OF DISCHARGE: 05/08/2016  PRIMARY CARE PHYSICIAN: No primary care provider on file.    ADMISSION DIAGNOSIS:  UTI (lower urinary tract infection) [N39.0] Hyperglycemia [R73.9] Fall, initial encounter [W19.XXXA]  DISCHARGE DIAGNOSIS:  Active Problems:   Hyperglycemia    Non complaint with medications   UTI  SECONDARY DIAGNOSIS:   Past Medical History  Diagnosis Date  . Diabetes mellitus without complication ([place])   . CHF (congestive heart failure) ([place])   . Coronary artery disease   . Hyperlipidemia   . BPH (benign prostatic hyperplasia)   . Hypertension     HOSPITAL COURSE:   1. Diabetes mellitus type 2: With hyperglycemia; no ketoacidosis. on basal insulin as well as sliding scale with meals to tighten glycemic control. Checked hemoglobin A1c- high. ADA diet.  He told- he was suppose to take 35 units insulin- but was not taking it.   In hospital blood sugar level came under control with 20 units, advised to have diet control.   Arranging for visiting nurse for disease management. 2. Urinary tract infection: Present on admission; the patient is symptomatic.  Nuitrates positive, 5 WBcs. Cx sent- no result back yet.    Pt is feeling much better- will give oral cefuroxime. 3. Essential hypertension: Uncontrolled; resume metoprolol and lisinopril. The patient admits that he does not take his medications as he should. BP normal now. 4. Heart disease: Unclear if the patient has coronary artery disease or nonischemic cardiomyopathy. He has symptoms of neither at this time. Obtain records from [facility]. Continue aspirin 5. Hyperlipidemia: Continue statin therapy 6. BPH: The patient has not been taking his medication and thus complains of  urinary hesitancy throughout the night. Resume terazosin and finasteride 7. DVT prophylaxis: Lovenox 8. GI prophylaxis: None as the patient is not critically ill 9. Disposition: Get PT eval. Suggested HHA- arrAnge for it. 10. ARF present on admission     Renal func improved with IV fluids.  DISCHARGE CONDITIONS:   Stable.  CONSULTS OBTAINED:     DRUG ALLERGIES:  No Known Allergies  DISCHARGE MEDICATIONS:   Current Discharge Medication List    START taking these medications   Details  cefUROXime (CEFTIN) 250 MG tablet Take 1 tablet (250 mg total) by mouth 2 (two) times daily with a meal. Qty: 10 tablet, Refills: 0    insulin glargine (LANTUS) 100 UNIT/ML injection Inject 0.2 mLs (20 Units total) into the skin at bedtime. Qty: 10 mL, Refills: 11      CONTINUE these medications which have NOT CHANGED   Details  aspirin 81 MG chewable tablet Chew 81 mg by mouth daily.    atorvastatin (LIPITOR) 80 MG tablet Take 40 mg by mouth at bedtime.    finasteride (PROSCAR) 5 MG tablet Take 5 mg by mouth daily.    lisinopril (PRINIVIL,ZESTRIL) 40 MG tablet Take 20 mg by mouth daily.    metoprolol tartrate (LOPRESSOR) 25 MG tablet Take 12.5 mg by mouth 2 (two) times daily.    terazosin (HYTRIN) 2 MG capsule Take 2 mg by mouth at bedtime.         DISCHARGE INSTRUCTIONS:    Follow with PMD in 2-3 weeks.  If you experience worsening of your admission symptoms, develop shortness of breath, life threatening  emergency, suicidal or homicidal thoughts you must seek medical attention immediately by calling 911 or calling your MD immediately  if symptoms less severe.  You Must read complete instructions/literature along with all the possible adverse reactions/side effects for all the Medicines you take and that have been prescribed to you. Take any new Medicines after you have completely understood and accept all the possible adverse reactions/side effects.   Please note  You were  cared for by a hospitalist during your hospital stay. If you have any questions about your discharge medications or the care you received while you were in the hospital after you are discharged, you can call the unit and asked to speak with the hospitalist on call if the hospitalist that took care of you is not available. Once you are discharged, your primary care physician will handle any further medical issues. Please note that NO REFILLS for any discharge medications will be authorized once you are discharged, as it is imperative that you return to your primary care physician (or establish a relationship with a primary care physician if you do not have one) for your aftercare needs so that they can reassess your need for medications and monitor your lab values.    Today   CHIEF COMPLAINT:   Chief Complaint  Patient presents with  . Hyperglycemia  . Fall    HISTORY OF PRESENT ILLNESS:  Elijah Castro  is a 75 y.o. male with a known history of diabetes presents emergency department after a fall. Apparently he had a mechanical fall in the bathroom and was stuck between the commode and wall. He denies hitting his head or having any pain in his extremities. He also denies chest pain, shortness of breath, nausea, vomiting or diarrhea. Laboratory evaluation in the emergency department showed significant hyperglycemia. After multiple doses of IV and subcutaneous insulin as well as 2 L of fluid the patient's blood sugar remained elevated which prompted the emergency department staff to call for admission.   VITAL SIGNS:  Blood pressure 130/76, pulse 71, temperature 98 F (36.7 C), temperature source Oral, resp. rate 18, height 5\' 10"  (1.778 m), weight 112.454 kg (247 lb 14.7 oz), SpO2 100 %.  I/O:   Intake/Output Summary (Last 24 hours) at 05/08/16 1104 Last data filed at 05/08/16 0727  Gross per 24 hour  Intake      0 ml  Output    650 ml  Net   -650 ml    PHYSICAL EXAMINATION:  GENERAL:   75 y.o.-year-old patient lying in the bed with no acute distress.  EYES: Pupils equal, round, reactive to light and accommodation. No scleral icterus. Extraocular muscles intact.  HEENT: Head atraumatic, normocephalic. Oropharynx and nasopharynx clear.  NECK:  Supple, no jugular venous distention. No thyroid enlargement, no tenderness.  LUNGS: Normal breath sounds bilaterally, no wheezing, rales,rhonchi or crepitation. No use of accessory muscles of respiration.  CARDIOVASCULAR: S1, S2 normal. No murmurs, rubs, or gallops.  ABDOMEN: Soft, non-tender, non-distended. Bowel sounds present. No organomegaly or mass.  EXTREMITIES: No pedal edema, cyanosis, or clubbing.  NEUROLOGIC: Cranial nerves II through XII are intact. Muscle strength 5/5 in all extremities. Sensation intact. Gait not checked.  PSYCHIATRIC: The patient is alert and oriented x 3.  SKIN: No obvious rash, lesion, or ulcer.   DATA REVIEW:   CBC  Recent Labs Lab 05/06/16 2058  WBC 5.7  HGB 14.5  HCT 43.6  PLT 158    Chemistries   Recent Labs Lab 05/08/16  0337  NA 139  K 4.4  CL 105  CO2 28  GLUCOSE 221*  BUN 18  CREATININE 1.04  CALCIUM 8.9    Cardiac Enzymes No results for input(s): TROPONINI in the last 168 hours.  Microbiology Results  Results for orders placed or performed during the hospital encounter of 05/06/16  Urine culture     Status: Abnormal   Collection Time: 05/06/16 10:38 PM  Result Value Ref Range Status   Specimen Description URINE, RANDOM  Final   Special Requests NONE  Final   Culture MULTIPLE SPECIES PRESENT, SUGGEST RECOLLECTION (A)  Final   Report Status 05/08/2016 FINAL  Final    RADIOLOGY:  No results found.  EKG:   Orders placed or performed during the hospital encounter of 05/06/16  . EKG 12-Lead  . EKG 12-Lead  . EKG 12-Lead  . EKG 12-Lead      Management plans discussed with the patient, family and they are in agreement.  CODE STATUS:     Code Status Orders         Start     Ordered   05/07/16 0406  Full code   Continuous     05/07/16 0405    Code Status History    Date Active Date Inactive Code Status Order ID Comments User Context   This patient has a current code status but no historical code status.    Advance Directive Documentation        Most Recent Value   Type of Advance Directive  Healthcare Power of Attorney   Pre-existing out of facility DNR order (yellow form or pink MOST form)     "MOST" Form in Place?        TOTAL TIME TAKING CARE OF THIS PATIENT: 40 minutes.    Vaughan Basta M.D on 05/08/2016 at 11:04 AM  Between 7am to 6pm - Pager - [phone]  After 6pm go to [email or web address] - password [email or web address] [facility]  Office  [phone]  CC: Primary care physician; No primary care provider on file.   Note: This dictation was prepared with Dragon dictation along with smaller phrase technology. Any transcriptional errors that result from this process are unintentional.

## 2016-05-08 NOTE — Progress Notes (Signed)
Inpatient Diabetes Program Recommendations  AACE/ADA: New Consensus Statement on Inpatient Glycemic Control (2015)  Target Ranges:  Prepandial:   less than 140 mg/dL      Peak postprandial:   less than 180 mg/dL (1-2 hours)      Critically ill patients:  140 - 180 mg/dL   Lab Results  Component Value Date   GLUCAP 278* 05/08/2016   HGBA1C 12.6* 05/06/2016    Review of Glycemic Control  Results for Elijah, Castro (MRN [id number]) as of 05/08/2016 08:31  Ref. Range 05/07/2016 07:57 05/07/2016 12:11 05/07/2016 16:55 05/07/2016 20:24 05/08/2016 07:26  Glucose-Capillary Latest Ref Range: 65-99 mg/dL 268 (H) 282 (H) 246 (H) 262 (H) 278 (H)    Diabetes history: Type 2 Outpatient Diabetes medications: MD note states patient said he was supposed to be taking 35 units ??type Current orders for Inpatient glycemic control: Lantus 12 units qhs, Novolog 0-15 units tid and hs  Inpatient Diabetes Program Recommendations: Patient last received Lantus insulin at 0630 yesterday am- none given at hs yesterday.  Consider increasing Lantus to 23 units qday (0.2units/kg) and change it to be given NOW otherwise he will remain high since he no longer has any basal insulin on board. Post prandial blood sugars remain elevated- consider increasing correction scale to resistant (0-20units) tid and change hs correction to the hs scale (0-5 units qhs).  Gentry Fitz, RN, BA, MHA, CDE Diabetes Coordinator Inpatient Diabetes Program  [phone] (Team Pager) [phone] ([place]) 05/08/2016 8:35 AM

## 2016-05-08 NOTE — Progress Notes (Signed)
Patient is discharged home with daughter. IV removed with cath intact. Reviewed all new meds, scripts, and last dose given.  Allowed time for questions. Reviewed with patient about how important checking blood sugars are and the S&S of high and low blood sugars.

## 2018-02-15 ENCOUNTER — Inpatient Hospital Stay (HOSPITAL_COMMUNITY): Payer: Medicare Other

## 2018-02-15 ENCOUNTER — Inpatient Hospital Stay (HOSPITAL_COMMUNITY)
Admission: EM | Admit: 2018-02-15 | Discharge: 2018-02-19 | DRG: 641 | Disposition: A | Discharge status: Home Health | Payer: Medicare Other | Attending: Internal Medicine | Admitting: Internal Medicine

## 2018-02-15 ENCOUNTER — Encounter (HOSPITAL_COMMUNITY): Payer: Self-pay

## 2018-02-15 ENCOUNTER — Emergency Department (HOSPITAL_COMMUNITY): Payer: Medicare Other

## 2018-02-15 DIAGNOSIS — Z7982 Long term (current) use of aspirin: Secondary | ICD-10-CM

## 2018-02-15 DIAGNOSIS — E860 Dehydration: Principal | ICD-10-CM | POA: Diagnosis present

## 2018-02-15 DIAGNOSIS — E872 Acidosis: Secondary | ICD-10-CM | POA: Diagnosis present

## 2018-02-15 DIAGNOSIS — N401 Enlarged prostate with lower urinary tract symptoms: Secondary | ICD-10-CM | POA: Diagnosis present

## 2018-02-15 DIAGNOSIS — C259 Malignant neoplasm of pancreas, unspecified: Secondary | ICD-10-CM | POA: Diagnosis present

## 2018-02-15 DIAGNOSIS — N320 Bladder-neck obstruction: Secondary | ICD-10-CM | POA: Diagnosis present

## 2018-02-15 DIAGNOSIS — E785 Hyperlipidemia, unspecified: Secondary | ICD-10-CM | POA: Diagnosis present

## 2018-02-15 DIAGNOSIS — R338 Other retention of urine: Secondary | ICD-10-CM | POA: Diagnosis present

## 2018-02-15 DIAGNOSIS — R404 Transient alteration of awareness: Secondary | ICD-10-CM | POA: Diagnosis not present

## 2018-02-15 DIAGNOSIS — I951 Orthostatic hypotension: Secondary | ICD-10-CM | POA: Diagnosis present

## 2018-02-15 DIAGNOSIS — E1165 Type 2 diabetes mellitus with hyperglycemia: Secondary | ICD-10-CM | POA: Diagnosis present

## 2018-02-15 DIAGNOSIS — E876 Hypokalemia: Secondary | ICD-10-CM | POA: Diagnosis present

## 2018-02-15 DIAGNOSIS — Z7902 Long term (current) use of antithrombotics/antiplatelets: Secondary | ICD-10-CM

## 2018-02-15 DIAGNOSIS — N138 Other obstructive and reflux uropathy: Secondary | ICD-10-CM | POA: Diagnosis present

## 2018-02-15 DIAGNOSIS — N35919 Unspecified urethral stricture, male, unspecified site: Secondary | ICD-10-CM | POA: Diagnosis not present

## 2018-02-15 DIAGNOSIS — Z8507 Personal history of malignant neoplasm of pancreas: Secondary | ICD-10-CM

## 2018-02-15 DIAGNOSIS — Z794 Long term (current) use of insulin: Secondary | ICD-10-CM

## 2018-02-15 DIAGNOSIS — I110 Hypertensive heart disease with heart failure: Secondary | ICD-10-CM | POA: Diagnosis present

## 2018-02-15 DIAGNOSIS — N39490 Overflow incontinence: Secondary | ICD-10-CM | POA: Diagnosis present

## 2018-02-15 DIAGNOSIS — I509 Heart failure, unspecified: Secondary | ICD-10-CM | POA: Diagnosis present

## 2018-02-15 DIAGNOSIS — Z960 Presence of urogenital implants: Secondary | ICD-10-CM | POA: Diagnosis not present

## 2018-02-15 DIAGNOSIS — E46 Unspecified protein-calorie malnutrition: Secondary | ICD-10-CM | POA: Diagnosis present

## 2018-02-15 DIAGNOSIS — D709 Neutropenia, unspecified: Secondary | ICD-10-CM | POA: Diagnosis present

## 2018-02-15 DIAGNOSIS — I10 Essential (primary) hypertension: Secondary | ICD-10-CM | POA: Diagnosis not present

## 2018-02-15 DIAGNOSIS — Z8673 Personal history of transient ischemic attack (TIA), and cerebral infarction without residual deficits: Secondary | ICD-10-CM | POA: Diagnosis not present

## 2018-02-15 DIAGNOSIS — Z1389 Encounter for screening for other disorder: Secondary | ICD-10-CM

## 2018-02-15 DIAGNOSIS — N39498 Other specified urinary incontinence: Secondary | ICD-10-CM | POA: Diagnosis not present

## 2018-02-15 DIAGNOSIS — Z833 Family history of diabetes mellitus: Secondary | ICD-10-CM

## 2018-02-15 DIAGNOSIS — Z6825 Body mass index (BMI) 25.0-25.9, adult: Secondary | ICD-10-CM

## 2018-02-15 DIAGNOSIS — R55 Syncope and collapse: Secondary | ICD-10-CM

## 2018-02-15 DIAGNOSIS — R339 Retention of urine, unspecified: Secondary | ICD-10-CM | POA: Clinically undetermined

## 2018-02-15 DIAGNOSIS — Z9660 Presence of unspecified orthopedic joint implant: Secondary | ICD-10-CM | POA: Diagnosis present

## 2018-02-15 DIAGNOSIS — Z87891 Personal history of nicotine dependence: Secondary | ICD-10-CM | POA: Diagnosis not present

## 2018-02-15 DIAGNOSIS — R4182 Altered mental status, unspecified: Secondary | ICD-10-CM | POA: Diagnosis present

## 2018-02-15 DIAGNOSIS — E119 Type 2 diabetes mellitus without complications: Secondary | ICD-10-CM | POA: Diagnosis not present

## 2018-02-15 DIAGNOSIS — I2510 Atherosclerotic heart disease of native coronary artery without angina pectoris: Secondary | ICD-10-CM | POA: Diagnosis present

## 2018-02-15 DIAGNOSIS — E1169 Type 2 diabetes mellitus with other specified complication: Secondary | ICD-10-CM | POA: Diagnosis not present

## 2018-02-15 DIAGNOSIS — F0390 Unspecified dementia without behavioral disturbance: Secondary | ICD-10-CM | POA: Diagnosis not present

## 2018-02-15 DIAGNOSIS — R739 Hyperglycemia, unspecified: Secondary | ICD-10-CM | POA: Diagnosis present

## 2018-02-15 LAB — COMPREHENSIVE METABOLIC PANEL
ALT: 22 U/L (ref 17–63)
AST: 30 U/L (ref 15–41)
Albumin: 3.5 g/dL (ref 3.5–5.0)
Alkaline Phosphatase: 134 U/L — ABNORMAL HIGH (ref 38–126)
Anion gap: 13 (ref 5–15)
BUN: 20 mg/dL (ref 6–20)
CO2: 18 mmol/L — AB (ref 22–32)
CREATININE: 0.99 mg/dL (ref 0.61–1.24)
Calcium: 9 mg/dL (ref 8.9–10.3)
Chloride: 106 mmol/L (ref 101–111)
GFR calc Af Amer: >60 mL/min (ref >60)
GFR calc non Af Amer: >60 mL/min (ref >60)
GLUCOSE: 104 mg/dL — AB (ref 65–99)
Potassium: 3.3 mmol/L — ABNORMAL LOW (ref 3.5–5.1)
SODIUM: 137 mmol/L (ref 135–145)
Total Bilirubin: 1.5 mg/dL — ABNORMAL HIGH (ref 0.3–1.2)
Total Protein: 7.2 g/dL (ref 6.5–8.1)

## 2018-02-15 LAB — GLUCOSE, CAPILLARY: GLUCOSE-CAPILLARY: 199 mg/dL — AB (ref 65–99)

## 2018-02-15 LAB — CBC WITH DIFFERENTIAL/PLATELET
Basophils Absolute: 0 10*3/uL (ref 0.0–0.1)
Basophils Relative: 2 %
EOS PCT: 1 %
Eosinophils Absolute: 0 10*3/uL (ref 0.0–0.7)
HCT: 30.8 % — ABNORMAL LOW (ref 39.0–52.0)
Hemoglobin: 10.2 g/dL — ABNORMAL LOW (ref 13.0–17.0)
LYMPHS ABS: 1.1 10*3/uL (ref 0.7–4.0)
LYMPHS PCT: 43 %
MCH: 29 pg (ref 26.0–34.0)
MCHC: 33.1 g/dL (ref 30.0–36.0)
MCV: 87.5 fL (ref 78.0–100.0)
MONO ABS: 0.2 10*3/uL (ref 0.1–1.0)
MONOS PCT: 9 %
Neutro Abs: 1.1 10*3/uL — ABNORMAL LOW (ref 1.7–7.7)
Neutrophils Relative %: 45 %
PLATELETS: 172 10*3/uL (ref 150–400)
RBC: 3.52 MIL/uL — AB (ref 4.22–5.81)
RDW: 15.2 % (ref 11.5–15.5)
WBC: 2.5 10*3/uL — ABNORMAL LOW (ref 4.0–10.5)

## 2018-02-15 LAB — CBG MONITORING, ED: GLUCOSE-CAPILLARY: 95 mg/dL (ref 65–99)

## 2018-02-15 LAB — SAVE SMEAR

## 2018-02-15 LAB — TROPONIN I
Troponin I: <0.03 ng/mL (ref <0.03)
Troponin I: <0.03 ng/mL (ref <0.03)

## 2018-02-15 LAB — BRAIN NATRIURETIC PEPTIDE: B Natriuretic Peptide: 83.7 pg/mL (ref 0.0–100.0)

## 2018-02-15 MED ORDER — ASPIRIN 81 MG PO CHEW
81.0000 mg | CHEWABLE_TABLET | Freq: Every day | ORAL | Status: DC
  Administered 2018-02-16 – 2018-02-19 (×4): 81 mg via ORAL
  Filled 2018-02-15 (×4): qty 1

## 2018-02-15 MED ORDER — SODIUM CHLORIDE 0.9 % IV SOLN
INTRAVENOUS | Status: DC
  Administered 2018-02-15 – 2018-02-17 (×6): via INTRAVENOUS

## 2018-02-15 MED ORDER — INSULIN ASPART 100 UNIT/ML ~~LOC~~ SOLN
0.0000 [IU] | Freq: Three times a day (TID) | SUBCUTANEOUS | Status: DC
Start: 2018-02-16 — End: 2018-02-15

## 2018-02-15 MED ORDER — INSULIN ASPART 100 UNIT/ML ~~LOC~~ SOLN
2.0000 [IU] | Freq: Three times a day (TID) | SUBCUTANEOUS | Status: DC
  Administered 2018-02-16: 2 [IU] via SUBCUTANEOUS

## 2018-02-15 MED ORDER — ATORVASTATIN CALCIUM 40 MG PO TABS
40.0000 mg | ORAL_TABLET | Freq: Every day | ORAL | Status: DC
  Administered 2018-02-15 – 2018-02-18 (×4): 40 mg via ORAL
  Filled 2018-02-15 (×6): qty 1

## 2018-02-15 MED ORDER — ENOXAPARIN SODIUM 40 MG/0.4ML ~~LOC~~ SOLN
40.0000 mg | SUBCUTANEOUS | Status: DC
  Administered 2018-02-15: 40 mg via SUBCUTANEOUS
  Filled 2018-02-15 (×2): qty 0.4

## 2018-02-15 MED ORDER — ACETAMINOPHEN 500 MG PO TABS: 1000.0000 mg | ORAL_TABLET | Freq: Four times a day (QID) | ORAL | Status: DC | PRN

## 2018-02-15 MED ORDER — CLOPIDOGREL BISULFATE 75 MG PO TABS: 75.0000 mg | ORAL_TABLET | Freq: Every day | ORAL | Status: DC

## 2018-02-15 MED ORDER — POTASSIUM CHLORIDE CRYS ER 20 MEQ PO TBCR
40.0000 meq | EXTENDED_RELEASE_TABLET | Freq: Once | ORAL | Status: AC
  Administered 2018-02-15: 40 meq via ORAL
  Filled 2018-02-15: qty 2
  Filled 2018-02-15: qty 1

## 2018-02-15 NOTE — ED Notes (Signed)
Attempted to call report

## 2018-02-15 NOTE — ED Triage Notes (Signed)
[place] [facility] for [place]- per daughter episode lasted x2 minutes. Pt hypotensive per EMS- 80/40. Pt has hx of mild dementia, is currently A+Ox4, answering all questions appropriately. Per EMS pt states has been c/o weakness this am. Pt daughter at bedside.

## 2018-02-15 NOTE — ED Notes (Signed)
Pt transported to MRI. MRI to transport pt to 4E bed assignment afterwards. Cyril Mourning, RN, made aware.

## 2018-02-15 NOTE — ED Notes (Signed)
Pt does not have a Pacemaker or Defib.

## 2018-02-15 NOTE — ED Provider Notes (Signed)
[facility] EMERGENCY DEPARTMENT Provider Note   CSN: [id number] Arrival date & time: 02/15/18  1217     History   Chief Complaint Chief Complaint  Patient presents with  . Loss of Consciousness    HPI Elijah Castro is a 77 y.o. male.  HPI  Resents after an episode of syncope. He is unsure what happened today, states that he feels fine now. Is here with his daughter who assists with the HPI. Patient was at church, standing, when he had the episode. No substantial pain afterwards, no substantial trauma. He was well prior to the onset of symptoms.  Past Medical History:  Diagnosis Date  . BPH (benign prostatic hyperplasia)   . CHF (congestive heart failure) ([place])   . Coronary artery disease   . Diabetes mellitus without complication ([place])   . Hyperlipidemia   . Hypertension     Patient Active Problem List   Diagnosis Date Noted  . Hyperglycemia 05/07/2016    Past Surgical History:  Procedure Laterality Date  . JOINT REPLACEMENT          Home Medications    Prior to Admission medications   Medication Sig Start Date End Date Taking? Authorizing Provider  acetaminophen (TYLENOL) 500 MG tablet Take 1,000 mg by mouth as needed for mild pain.   Yes [provider]  aspirin 81 MG chewable tablet Chew 81 mg by mouth daily.   Yes [provider]  atorvastatin (LIPITOR) 80 MG tablet Take 40 mg by mouth at bedtime.   Yes [provider]  clopidogrel (PLAVIX) 75 MG tablet Take 75 mg by mouth daily. 10/12/17  Yes [provider]  finasteride (PROSCAR) 5 MG tablet Take 5 mg by mouth daily.   Yes [provider]  Insulin Aspart (NOVOLOG FLEXPEN [place]) Inject 5-10 Units into the skin 3 (three) times daily before meals.   Yes [provider]  lisinopril (PRINIVIL,ZESTRIL) 40 MG tablet Take 20 mg by mouth daily.   Yes [provider]  megestrol (MEGACE) 20 MG tablet Take 20 mg by mouth daily. 02/10/18  02/10/19 Yes [provider]  metoprolol tartrate (LOPRESSOR) 25 MG tablet Take 12.5 mg by mouth 2 (two) times daily.   Yes [provider]  ondansetron (ZOFRAN-ODT) 4 MG disintegrating tablet Take 4 mg by mouth every 6 (six) hours as needed for nausea/vomiting. 01/27/18  Yes [provider]  terazosin (HYTRIN) 2 MG capsule Take 2 mg by mouth at bedtime.   Yes [provider]  insulin glargine (LANTUS) 100 UNIT/ML injection Inject 0.2 mLs (20 Units total) into the skin at bedtime. Patient not taking: Reported on 02/15/2018 05/08/16   Vaughan Basta, MD    Family History Family History  Problem Relation Age of Onset  . Diabetes Mellitus II Daughter     Social History Social History   Tobacco Use  . Smoking status: Former Smoker  Substance Use Topics  . Alcohol use: No  . Drug use: No     Allergies   Patient has no known allergies.   Review of Systems Review of Systems  Constitutional:       Per HPI, otherwise negative  HENT:       Per HPI, otherwise negative  Respiratory:       Per HPI, otherwise negative  Cardiovascular:       Per HPI, otherwise negative  Gastrointestinal: Negative for vomiting.  Endocrine:       Negative aside from HPI  Genitourinary:       Neg aside from HPI   Musculoskeletal:       Per HPI, otherwise negative  Skin: Negative.   Neurological: Positive for syncope.     Physical Exam Updated Vital Signs BP 106/61   Pulse 67   Temp 97.8 F (36.6 C) (Oral)   Resp 18   Ht 5' 8.5" (1.74 m)   SpO2 100%   BMI 37.15 kg/m   Physical Exam  Constitutional: He is oriented to person, place, and time. He appears well-developed. No distress.  HENT:  Head: Normocephalic and atraumatic.  Eyes: Conjunctivae and EOM are normal.  Cardiovascular: Normal rate and regular rhythm.  Pulmonary/Chest: Effort normal. No stridor. No respiratory distress.  Abdominal: He exhibits no distension.  Musculoskeletal: He  exhibits no edema.  Neurological: He is alert and oriented to person, place, and time.  Skin: Skin is warm and dry.  Psychiatric: He has a normal mood and affect.  Nursing note and vitals reviewed.    ED Treatments / Results  Labs (all labs ordered are listed, but only abnormal results are displayed) Labs Reviewed  COMPREHENSIVE METABOLIC PANEL - Abnormal; Notable for the following components:      Result Value   Potassium 3.3 (*)    CO2 18 (*)    Glucose, Bld 104 (*)    Alkaline Phosphatase 134 (*)    Total Bilirubin 1.5 (*)    All other components within normal limits  CBC WITH DIFFERENTIAL/PLATELET - Abnormal; Notable for the following components:   WBC 2.5 (*)    RBC 3.52 (*)    Hemoglobin 10.2 (*)    HCT 30.8 (*)    Neutro Abs 1.1 (*)    All other components within normal limits  TROPONIN I  BRAIN NATRIURETIC PEPTIDE  CBG MONITORING, ED    EKG EKG Interpretation  Date/Time:  Sunday February 15 2018 12:44:11 EDT Ventricular Rate:  67 PR Interval:    QRS Duration: 102 QT Interval:  397 QTC Calculation: 420 R Axis:   3 Text Interpretation:  Junctional rhythm Consider anterior infarct T wave abnormality Abnormal ekg Confirmed by Carmin Muskrat [phone]) on 02/15/2018 12:49:55 PM Also confirmed by Carmin Muskrat ([id number]), editor Philomena Doheny [phone])  on 02/15/2018 1:38:28 PM   Radiology Dg Chest 2 View  Result Date: 02/15/2018 CLINICAL DATA:  Syncope, history CHF, coronary artery disease, hypertension, diabetes mellitus, former smoker EXAM: CHEST - 2 VIEW COMPARISON:  None FINDINGS: Normal heart size, mediastinal contours, and pulmonary vascularity. Lungs minimally hyperaerated but clear. No pleural effusion or pneumothorax. Bones unremarkable. IMPRESSION: No acute abnormalities. Electronically Signed   By: Lavonia Dana M.D.   On: 02/15/2018 13:21    Procedures Procedures (including critical care time)  Medications Ordered in ED Medications  0.9 %  sodium chloride  infusion ( Intravenous New Bag/Given 02/15/18 1242)     Initial Impression / Assessment and Plan / ED Course  I have reviewed the triage vital signs and the nursing notes.  Pertinent labs & imaging results that were available during my care of the patient were reviewed by me and considered in my medical decision making (see chart for details).     2:59 PM Patient in similar condition, blood pressure improved. I discussed all findings, and the daughter now states that the patient may have had seizure-like activity, though without a postictal phase.  The patient has no seizure history. Given the unclear circumstances of his syncope event, his age, comorbidities, patient  will be admitted for further evaluation and management.  Final Clinical Impressions(s) / ED Diagnoses  Syncope   Carmin Muskrat, MD 02/15/18 1500

## 2018-02-15 NOTE — ED Notes (Signed)
Patient given bag meal with drink. Per Dr. Vanita Panda.

## 2018-02-15 NOTE — H&P (Signed)
Date: 02/15/2018               Patient Name:  Elijah Castro MRN: [id number]  DOB: [birth year] Age / Sex: 77 y.o., male   PCP: System, Pcp Not In         Medical Service: Internal Medicine Teaching Service         Attending Physician: Dr. Evette Doffing, Mallie Mussel, *    First Contact: Miachel Roux, MS4 Pager: [phone]  Second Contact: Dr. Ledell Noss Pager: [phone]       After Hours (After 5p/  First Contact Pager: [phone]  weekends / holidays): Second Contact Pager: [phone]   Chief Complaint: Episode of staring and AMS  History of Present Illness: Elijah Castro is a 77 y.o. M with PMH recently diagnosed pancreatic adenocarcinoma, CVA, CAD, DM2, HLD, HTN and BPH presenting with his daughter following an episode of staring and unresponsiveness this morning. Much of the history was provided by the patient's daughter, as the pt did not remember events surrounding this episode. Elijah Castro was reportedly at baseline this morning before church. After Sunday school (~11am), she noticed that his head was drooping more than usual but noticed nothing else unusual. During a subsequent conversation, the patient stopped talking midsentence and had a blank stare with glazed eyes. He was also noted to be shaking his head slightly. He remained unresponsive for approximately 2 minutes, after which he regained some alertness but did not talk and did not respond to commands. EMS was called, and when they arrived, the patient was able to interact with them appropriately. Pt's daughter denies one-sided weakness or drooping, urinary/fecal incontinence, head trauma or vomiting. At baseline, he is cognitively impaired and requires help with medication administration, dressing and feeding. He uses a walker to ambulate. He has had urinary and fecal incontinence for approximately a year.   Of note, Elijah Castro has had other concerning episodes recently. Approximately one week ago, he suffered an unwitnessed fall  while at a family member's house. He scraped his nose and lip, but daughter is uncertain if he exhibited neurological deficits, staring or word-finding difficulty at the time as she was not there. In December, he was admitted to [facility] for temporary speech difficulty. Imaging results listed below. Pt was staying with his other daughter at the time and does not recall what happened, so history is limited. He was switched from aspirin to Plavix.    Meds:  Current Meds  Medication Sig  . acetaminophen (TYLENOL) 500 MG tablet Take 1,000 mg by mouth as needed for mild pain.  Marland Kitchen aspirin 81 MG chewable tablet Chew 81 mg by mouth daily.  Marland Kitchen atorvastatin (LIPITOR) 80 MG tablet Take 40 mg by mouth at bedtime.  . clopidogrel (PLAVIX) 75 MG tablet Take 75 mg by mouth daily.  . finasteride (PROSCAR) 5 MG tablet Take 5 mg by mouth daily.  . Insulin Aspart (NOVOLOG FLEXPEN [place]) Inject 5-10 Units into the skin 3 (three) times daily before meals.  Marland Kitchen lisinopril (PRINIVIL,ZESTRIL) 40 MG tablet Take 20 mg by mouth daily.  . megestrol (MEGACE) 20 MG tablet Take 20 mg by mouth daily.  . metoprolol tartrate (LOPRESSOR) 25 MG tablet Take 12.5 mg by mouth 2 (two) times daily.  . ondansetron (ZOFRAN-ODT) 4 MG disintegrating tablet Take 4 mg by mouth every 6 (six) hours as needed for nausea/vomiting.  . terazosin (HYTRIN) 2 MG capsule Take 2 mg by mouth at bedtime.    Allergies:  Allergies as of 02/15/2018  . (No Known Allergies)   Past Medical History: Pancreatic adenocarcinoma diagnosed in March at [facility] in [place]: currently being evaluated for port placement, radiation, chemotherapy and resection BPH CHF, though last echo at [facility] shows G1DD and EF 50-55% CAD DM2  HLD HTN  Family History:  Family History  Problem Relation Age of Onset  . Diabetes Mellitus II Daughter    Social History: Lives at home with daughter, who assists him with most ADLs. She hopes to get health from home health  nurse given extent of assistance needed with ADLs. Pt has not smoked for >40 years. Remote history of alcohol use, quantity uncertain. No recreational drug use. [employer] veteran of [place] war.  Review of Systems: Review of Systems  Constitutional: Positive for malaise/fatigue and weight loss. Negative for chills and fever.  HENT: Negative for hearing loss and sore throat.   Eyes: Negative for blurred vision.  Respiratory: Negative for cough and shortness of breath.   Cardiovascular: Negative for chest pain, palpitations and leg swelling.  Gastrointestinal: Negative for abdominal pain, constipation, diarrhea, nausea and vomiting.  Musculoskeletal: Positive for falls and joint pain.  Skin: Negative for itching and rash.  Neurological: Positive for dizziness, tremors and weakness. Negative for focal weakness and seizures.    Physical Exam: Blood pressure 106/61, pulse 67, temperature 97.8 F (36.6 C), temperature source Oral, resp. rate 18, height 5' 8.5" (1.74 m), SpO2 100 %. General Appearance: Well developed, well nourished, pleasant, confused man in no acute distress. Skin: Healing scrape on left nare. No rashes or petechiae noted. HEENT: Sclerae anicteric and conjunctivae pink and moist. Pupils constricted with minimal response to light. EOMs intact. Lips, teeth, and gums showed normal mucosa. The oral mucosa, hard and soft palate, tongue and posterior pharynx were normal. Neck: Supple and symmetric. No thyroid enlargement, tenderness or masses.  Lungs: Normal WOB without any wheezing or crackles. Cardiovascular: Tachycardic, irregular rhythm, soft heart sounds, no m/r/g. Carotid pulses normal and 2+ bilaterally without bruits. Peripheral pulses 1+ and symmetric. Abdomen: Soft and nontender with normal bowel sounds. No palpable HSM.  Musculoskeletal: No joint tenderness or effusions noted. Muscle strength and tone normal. Extremities: No cyanosis, clubbing or edema. Neurologic: Alert and  oriented x 3. CN II-XII intact except for decreased pupillary response. Dysdiadochokinesia present. No pronator drift or dysmetria. Narrow-based gait, at baseline per daughter. Decreased DTRs in UE and LE. Sensation to touch intact.   EKG: personally reviewed my interpretation is signs of anterior infarct, similar to EKG 05/2016, junctional rhythm  CXR: personally reviewed my interpretation is no cardiomegaly, consolidations or effusions  02/12/18 Chest CT per Care Everywhere: Impression: No signs of metastatic cancer detected in the chest  10/11/17 Carotid Doppler:  Impression: Carotid doppler showed no evidence for hemodynamically significant carotid stenoses. echo and carotid dopplers.  10/11/17 CT brain wo contrast: Impression: no acute process and old left frontal lobe infarct and extensive chronic small vessel ischemia.  10/11/17 Echocardiogram: Summary: The left ventricle is mildly dilated.Impaired relaxation, grade 1 diastolic dysfunction.LVEF 50-55%  Assessment & Plan by Problem: Active Problems:   Altered mental status  Elijah Castro is a 77 y.o. M with PMH recently diagnosed pancreatic adenocarcinoma, CVA, CAD, DM2, HLD, HTN and BPH presenting with an episode of altered mental status manifested by staring and unresponsiveness. He is currently at his baseline level of functioning.  AMS secondary to Syncope vs TIA with hx of CVA: Given CVA risk factors (HTN, HLD, DM2) and  chronic vascular disease noted on previous head CT, favor vascular etiology such as TIA vs stroke with underlying chronic dementia. Junctional rhythm on EKG and irregular rhythm on auscultation may increase risk of embolic phenomenon, though no thrombi or left atrial enlargement were noted on echo 09/2017. Differential also includes seizure given episodic nature as well as tremulousness. Hypoglycemia unlikely given glucose 104 on admission. Potential rare etiologies include normal pressure hydrocephalus, though it seems  urinary incontinence and dementia have been longstanding (~1 yr) with no evidence of hydrocephalus on CT brain in November 2018. Plan is for stroke workup. Carotid evaluation may be withheld given recent doppler with no significant stenosis.  - MRI brain with chronic microvascular insults  - EEG - positive orthostatic vitals, will hold finasteride, terazosin, and metoprolol and provide light fluids overnight  - continue daily aspirin  - bladder scan  Risk factor stratification: - last lipid panel 10/2017: LDL 29, HDL 52, total cholesterol 97 - A1c 8.7 (10/2017) - on metoprolol, atorva and ASA at home  CAD: History of CAD reported but little history able to be obtained in chart. EKG today and 09/2017 demonstrate anterior infarct, suggesting prior MI. Trop nl at this time and no symptoms of ACS. - continue ASA 81 mg po qd - continue atorvastatin 80 mg po qd - hold metoprolol 12.5 mg po qd given hypotensive on admission  Pancreatic adenocarcinoma: Workup progressing at [place]. Tbili and alk phos mildly elevated with no signs of acute biliary obstruction. Bili down from ~4 at time of pancreatic cancer diagnosis. - continue megestrol 20 mg po qd for appetite stimulation  Normal anion gap metabolic acidosis: Possibly secondary to increased acid production in setting of malignancy. Differential includes diarrhea given concurrent hypokalemia, though this was not reported on interview. CMP 10/2017 showed no acidosis, suggesting acute process and thus making RTA less likely.  - repeat BMP 4/15 AM  DM2: A1c 8.7 (10/2017).  - continue insulin aspart 3 u tid - repeat A1c  BPH with urinary incontinence: History of BPH suggests possible overflow incontinence. DDx includes diabetic bladder dysfunction given concurrent fecal incontinence. May also consider UTI, though his incontinence appears to be chronic.  - hold home terazosin 2 mg po qd in setting of hypotension - continue finasteride 5 mg po qd -  bladder scan - collect UA  FEN/GI: F: none E: replete as needed N: HH GI: none needed at this time  DVT PPX: Lovenox   Dispo: Admit patient to Inpatient with expected length of stay greater than 2 midnights.  PT/OT consulted  Signed: Sarina Ser, Medical Student 02/15/2018, 4:49 PM  Pager: [phone]  Attestation for Student Documentation:  I personally was present and performed or re-performed the history, physical exam and medical decision-making activities of this service and have verified that the service and findings are accurately documented in the student's note.  Ledell Noss, MD 02/15/2018, 8:15 PM

## 2018-02-16 ENCOUNTER — Ambulatory Visit (HOSPITAL_COMMUNITY): Payer: No Typology Code available for payment source

## 2018-02-16 ENCOUNTER — Inpatient Hospital Stay (HOSPITAL_COMMUNITY): Payer: Medicare Other

## 2018-02-16 DIAGNOSIS — C259 Malignant neoplasm of pancreas, unspecified: Secondary | ICD-10-CM

## 2018-02-16 DIAGNOSIS — E876 Hypokalemia: Secondary | ICD-10-CM

## 2018-02-16 DIAGNOSIS — F0390 Unspecified dementia without behavioral disturbance: Secondary | ICD-10-CM

## 2018-02-16 DIAGNOSIS — E119 Type 2 diabetes mellitus without complications: Secondary | ICD-10-CM

## 2018-02-16 DIAGNOSIS — N401 Enlarged prostate with lower urinary tract symptoms: Secondary | ICD-10-CM

## 2018-02-16 DIAGNOSIS — Z79899 Other long term (current) drug therapy: Secondary | ICD-10-CM

## 2018-02-16 DIAGNOSIS — Z7982 Long term (current) use of aspirin: Secondary | ICD-10-CM

## 2018-02-16 DIAGNOSIS — E785 Hyperlipidemia, unspecified: Secondary | ICD-10-CM

## 2018-02-16 DIAGNOSIS — N39490 Overflow incontinence: Secondary | ICD-10-CM

## 2018-02-16 DIAGNOSIS — Z8673 Personal history of transient ischemic attack (TIA), and cerebral infarction without residual deficits: Secondary | ICD-10-CM

## 2018-02-16 DIAGNOSIS — I10 Essential (primary) hypertension: Secondary | ICD-10-CM

## 2018-02-16 DIAGNOSIS — I951 Orthostatic hypotension: Secondary | ICD-10-CM

## 2018-02-16 DIAGNOSIS — R404 Transient alteration of awareness: Secondary | ICD-10-CM

## 2018-02-16 DIAGNOSIS — I2510 Atherosclerotic heart disease of native coronary artery without angina pectoris: Secondary | ICD-10-CM

## 2018-02-16 LAB — CBC
HEMATOCRIT: 30.8 % — AB (ref 39.0–52.0)
HEMOGLOBIN: 10 g/dL — AB (ref 13.0–17.0)
MCH: 28.8 pg (ref 26.0–34.0)
MCHC: 32.5 g/dL (ref 30.0–36.0)
MCV: 88.8 fL (ref 78.0–100.0)
Platelets: 170 10*3/uL (ref 150–400)
RBC: 3.47 MIL/uL — AB (ref 4.22–5.81)
RDW: 15.5 % (ref 11.5–15.5)
WBC: 3.2 10*3/uL — ABNORMAL LOW (ref 4.0–10.5)

## 2018-02-16 LAB — BASIC METABOLIC PANEL
ANION GAP: 10 (ref 5–15)
BUN: 13 mg/dL (ref 6–20)
CALCIUM: 8.9 mg/dL (ref 8.9–10.3)
CO2: 22 mmol/L (ref 22–32)
CREATININE: 0.86 mg/dL (ref 0.61–1.24)
Chloride: 108 mmol/L (ref 101–111)
Glucose, Bld: 164 mg/dL — ABNORMAL HIGH (ref 65–99)
Potassium: 3 mmol/L — ABNORMAL LOW (ref 3.5–5.1)
Sodium: 140 mmol/L (ref 135–145)

## 2018-02-16 LAB — URINALYSIS, ROUTINE W REFLEX MICROSCOPIC
BILIRUBIN URINE: NEGATIVE
Glucose, UA: NEGATIVE mg/dL
Hgb urine dipstick: NEGATIVE
KETONES UR: 5 mg/dL — AB
LEUKOCYTES UA: NEGATIVE
NITRITE: NEGATIVE
PROTEIN: NEGATIVE mg/dL
Specific Gravity, Urine: 1.012 (ref 1.005–1.030)
pH: 6 (ref 5.0–8.0)

## 2018-02-16 LAB — GLUCOSE, CAPILLARY
GLUCOSE-CAPILLARY: 160 mg/dL — AB (ref 65–99)
GLUCOSE-CAPILLARY: 212 mg/dL — AB (ref 65–99)
GLUCOSE-CAPILLARY: 274 mg/dL — AB (ref 65–99)
Glucose-Capillary: 194 mg/dL — ABNORMAL HIGH (ref 65–99)

## 2018-02-16 LAB — HEMOGLOBIN A1C
Hgb A1c MFr Bld: 8.5 % — ABNORMAL HIGH (ref 4.8–5.6)
Mean Plasma Glucose: 197.25 mg/dL

## 2018-02-16 LAB — MAGNESIUM: Magnesium: 2 mg/dL (ref 1.7–2.4)

## 2018-02-16 MED ORDER — FINASTERIDE 5 MG PO TABS
5.0000 mg | ORAL_TABLET | Freq: Every day | ORAL | Status: DC
  Administered 2018-02-16 – 2018-02-17 (×2): 5 mg via ORAL
  Filled 2018-02-16 (×2): qty 1

## 2018-02-16 MED ORDER — TERAZOSIN HCL 2 MG PO CAPS
2.0000 mg | ORAL_CAPSULE | Freq: Every day | ORAL | Status: DC
  Administered 2018-02-16 – 2018-02-18 (×3): 2 mg via ORAL
  Filled 2018-02-16 (×4): qty 1

## 2018-02-16 MED ORDER — LIDOCAINE HCL 2 % EX GEL
1.0000 "application " | Freq: Once | CUTANEOUS | Status: DC
  Filled 2018-02-16: qty 5

## 2018-02-16 MED ORDER — POTASSIUM CHLORIDE CRYS ER 20 MEQ PO TBCR
40.0000 meq | EXTENDED_RELEASE_TABLET | Freq: Two times a day (BID) | ORAL | Status: AC
  Administered 2018-02-16 (×2): 40 meq via ORAL
  Filled 2018-02-16 (×2): qty 2

## 2018-02-16 NOTE — Progress Notes (Signed)
   I saw and examined the patient. I reviewed the resident's note and I agree with the resident's findings and plan as documented in the resident's note.  77 year old man with dementia and pancreatic adenocarcinoma admitted with a transient altered level of consciousness/presyncope type symptoms yesterday.  This seems to have resolved with supportive care.  Probably dehydration in the setting of poor oral intake, orthostatic hypotension.  He is quite deconditioned, likely due to the pancreatic cancer, PT and OT recommending subacute rehabilitation because of high fall risk, weakness, and poor self awareness about his own safety due to dementia.  We will start working on placement options through the [facility].  Axel Filler, MD 02/16/2018, 12:50 PM

## 2018-02-16 NOTE — Progress Notes (Signed)
Inpatient Diabetes Program Recommendations  AACE/ADA: New Consensus Statement on Inpatient Glycemic Control (2015)  Target Ranges:  Prepandial:   less than 140 mg/dL      Peak postprandial:   less than 180 mg/dL (1-2 hours)      Critically ill patients:  140 - 180 mg/dL   Lab Results  Component Value Date   GLUCAP 194 (H) 02/16/2018   HGBA1C 8.5 (H) 02/16/2018    Review of Glycemic Control Results for ROSCO, HARRIOTT (MRN [id number]) as of 02/16/2018 16:00  Ref. Range 02/15/2018 12:39 02/15/2018 20:21 02/16/2018 06:10 02/16/2018 11:45  Glucose-Capillary Latest Ref Range: 65 - 99 mg/dL 95 199 (H) 160 (H) 194 (H)   Diabetes history: Type 2 DM Outpatient Diabetes medications: Lantus 20 units QHS, Novolog 5-10 units TID Current orders for Inpatient glycemic control: none  Inpatient Diabetes Program Recommendations:    Reviewed patient's current A1c of 8.5%. Explained what a A1c is and what it measures. Also reviewed goal A1c with patient, importance of good glucose control @ home, and blood sugar goals. Discussed patho of DM, need for dietary changes, importance of eliminating sugary beverages from diet, and long term co-morbidies that arise from poorly controlled diabetes. Patient has a meter and supplies.  Noted work being conducted for [facility] placement for rehab.    Spoke with team about regular diet, plan to change to carb mod.  Thanks, Bronson Curb, MSN, RNC-OB Diabetes Coordinator [phone] (8a-5p)

## 2018-02-16 NOTE — Progress Notes (Signed)
Subjective: Mr. Jesson was in good spirits and communicating at baseline this morning. He was able to stand and ambulate to his chair. He denies any pain, shortness of breath or difficulty eating or urinating.   Collateral: Attempted to call pt's daughter with no response  Objective:  Vital signs in last 24 hours: Vitals:   02/15/18 1630 02/15/18 1700 02/15/18 1951 02/16/18 0512  BP: 125/72 108/89 (!) 154/98 133/64  Pulse: 90 (!) 101 89   Resp:  (!) 23 18   Temp:   97.9 F (36.6 C)   TempSrc:   Oral   SpO2: 99% 98% 100%   Weight:   77.5 kg (170 lb 13.7 oz)   Height:   5\' 8"  (1.727 m)    Physical Exam: General Appearance: Well developed, well nourished, in no acute distress. Lungs: Normal WOB without any wheezing or crackles. Cardiovascular: Normal rate, regular rhythm, no m/r/g. Peripheral pulses 1+ and symmetric. Abdomen: Soft and nontender with normal bowel sounds.  Musculoskeletal: No joint tenderness or effusions noted. Muscle strength and tone normal. Extremities: No cyanosis, clubbing or edema.  Labs: CMP: K 3.0 CBC: Hgb 10  Imaging: 4/14 MRI brain wo contrast: 1. No acute finding. 2. Advanced chronic small vessel ischemia. 3. Remote small vessel infarcts in the bilateral cerebellum, left occipital cortex, and left posterior frontal cortex.  Assessment/Plan:  Active Problems:   Altered mental status  Mr. Lofquist is a 77 y.o. M with PMH recently diagnosed pancreatic adenocarcinoma, CVA, CAD, DM2, HLD, HTN and BPH presenting with transient altered mental status manifested by staring and unresponsiveness with no acute process visualized on MRI. He is currently at his baseline level of functioning.  Transient AMS: Differential includes syncope vs presyncope vs transient hypoglycemia. Positive orthostatic VS with poor intake at baseline, suggesting dehydration as potential inciting factor. Stroke and CNS metastasis ruled out by MRI brain. No significant metabolic  derangements on lab evaluation. Seizure may also be considered; however, EEG would be low yield given pt has been at baseline since yesterday. No chest pain or consistent arrhythmia to suggest cardiac etiology. EKG stable from prior.  - recheck orthostatic vitals - IV fluid rehydration given orthostasis - hold home metoprolol in setting of orthostasis - hold insulin given risk of hypoglycemia with poor oral intake  History of CVA: MRI confirms small vessel of ischemia and remote infarcts. No focal neurological deficits deviating from baseline at this time. - continue ASA 81 mg po qd, atorvastatin 80 mg po qd  Hypokalemia: Favor GI losses vs acidosis from malignancy. Normal K in 10/2017, suggesting a more acute process. - KCl 40 mEq po x2  Pancreatic adenocarcinoma: Candidate [facility] resection and [facility]oad[facility]. Megestrol started for appetite stimulation, but has limited efficacy and may be contributing to AMS. - hold megestrol  DM2: A1c 8.7. Glucose <200 during this hospitalization. - hold insulin in setting of possible hypoglycemic episodes and poor oral intake  BPH with urinary incontinence: Suggestive of overflow incontinence with possible diabetic bladder dysfunction. UTI benign.  - resume terazosin 2 mg po qd and finasteride 5 mg po qd  FEN/GI: F: NS 125 mL/hr E: replete K N: carb modified GI: none needed at this time  DVT PPX: Lovenox  Dispo: Anticipated discharge in approximately 1 day(s) to SNF per PT eval  Sarina Ser, Medical Student 02/16/2018, 11:20 AM Pager: [phone]   Attestation for Student Documentation:  I personally was present and performed or re-performed the history, physical  exam and medical decision-making activities of this service and have verified that the service and findings are accurately documented in the student's note.  Axel Filler, MD 02/16/2018, 2:47 PM

## 2018-02-16 NOTE — Progress Notes (Signed)
Attempted to place 77F coude' catheter however met with resistance and could not advance catheter further dispite using lidocaine gel with insertion. RN made aware. Need to notify urology services. Margarito Liner

## 2018-02-16 NOTE — Progress Notes (Signed)
EEG tech notified to cancel EEG order, per MD.

## 2018-02-16 NOTE — Progress Notes (Signed)
Nurse tech attempted in and out catheter x2, without success. Unable to advance catheter due to significant resistance. MD paged.   Ara Kussmaul BSN, RN

## 2018-02-16 NOTE — Progress Notes (Signed)
Bladder scan indicates 1072 mL of urine after pt had urinated. MD notified. Will await orders.   Ara Kussmaul BSN, RN

## 2018-02-16 NOTE — Consult Note (Addendum)
Subjective: CC: elevated PVR.  Hx: I was asked to see Elijah Castro in consult by Dr. Albertina Senegal for difficulty voiding.  He was admitted for MS changes with an arrhythmia.  During this admission he was noted to be voiding frequently but small amounts and was found to have a PVR of 1071m.  He has been wearing depends for about 2 years for incontinence.  He has a history of BPH with BOO and is on finasteride and terazosin.  [facility] evaluation of his voiding symptoms.   He has had no hematuria or dysuria.   He had a UTI in 3/19.   He has had no GU surgery.  He had a recent bowel obstruction from a pancreatic mass and is to have a port placed in the near future for treatment.    ROS:  Review of Systems  Constitutional: Negative for fever.  Respiratory: Negative for shortness of breath.   Cardiovascular: Negative for chest pain.  Gastrointestinal: Negative for abdominal pain.  All other systems reviewed and are negative.   No Known Allergies  Past Medical History:  Diagnosis Date  . BPH (benign prostatic hyperplasia)   . CHF (congestive heart failure) ([facility]   . Coronary artery disease   . Diabetes mellitus without complication ([facility]   . Hyperlipidemia   . Hypertension     Past Surgical History:  Procedure Laterality Date  . JOINT REPLACEMENT     Bowel stent   Social History   Socioeconomic History  . Marital status: Divorced    Spouse name: Not on file  . Number of children: Not on file  . Years of education: Not on file  . Highest education level: Not on file  Occupational History  . Not on file  Social Needs  . Financial resource strain: Not on file  . Food insecurity:    Worry: Not on file    Inability: Not on file  . Transportation needs:    Medical: Not on file    Non-medical: Not on file  Tobacco Use  . Smoking status: Former Smoker  Substance and Sexual Activity  . Alcohol use: No  .  Drug use: No  . Sexual activity: Not on file  Lifestyle  . Physical activity:    Days per week: Not on file    Minutes per session: Not on file  . Stress: Not on file  Relationships  . Social connections:    Talks on phone: Not on file    Gets together: Not on file    Attends religious service: Not on file    Active member of club or organization: Not on file    Attends meetings of clubs or organizations: Not on file    Relationship status: Not on file  . Intimate partner violence:    Fear of current or ex partner: Not on file    Emotionally abused: Not on file    Physically abused: Not on file    Forced sexual activity: Not on file  Other Topics Concern  . Not on file  Social History Narrative  . Not on file    Family History  Problem Relation Age of Onset  . Diabetes Mellitus II Daughter     Anti-infectives: Anti-infectives (From admission, onward)   None      Current Facility-Administered Medications  Medication Dose Route Frequency Provider Last Rate Last Dose  . 0.9 %  sodium chloride infusion   Intravenous Continuous Ledell Noss, MD 125 mL/hr at 02/16/18 1548    . acetaminophen (TYLENOL) tablet 1,000 mg  1,000 mg Oral Q6H PRN Ledell Noss, MD      . aspirin chewable tablet 81 mg  81 mg Oral Daily Ledell Noss, MD   81 mg at 02/16/18 0836  . atorvastatin (LIPITOR) tablet 40 mg  40 mg Oral QHS Ledell Noss, MD   40 mg at 02/15/18 2148  . enoxaparin (LOVENOX) injection 40 mg  40 mg Subcutaneous Q24H Ledell Noss, MD   40 mg at 02/15/18 2148  . finasteride (PROSCAR) tablet 5 mg  5 mg Oral Daily Jule Ser, DO   5 mg at 02/16/18 1242  . lidocaine (XYLOCAINE) 2 % jelly 1 application  1 application Urethral Once Axel Filler, MD      . potassium chloride SA (K-DUR,KLOR-CON) CR tablet 40 mEq  40 mEq Oral BID Neva Seat, MD   40 mEq at 02/16/18 0836  . terazosin (HYTRIN) capsule 2 mg  2 mg Oral QHS Kathi Ludwig, MD         Objective: Vital signs in  last 24 hours: Temp:  [98.2 F (36.8 C)] 98.2 F (36.8 C) (04/15 1245) Pulse Rate:  [76] 76 (04/15 1146) Resp:  [16] 16 (04/15 1146) BP: (130-133)/(64-66) 130/66 (04/15 1146)  Intake/Output from previous day: 04/14 0701 - 04/15 0700 In: 550 [I.V.:550] Out: -  Intake/Output this shift: No intake/output data recorded.   Physical Exam  Constitutional: He appears well-developed and well-nourished.  HENT:  Head: Normocephalic and atraumatic.  Neck: Normal range of motion. Neck supple. No thyromegaly present.  Cardiovascular: Normal rate, regular rhythm and normal heart sounds.  Pulmonary/Chest: Breath sounds normal. No respiratory distress.  Abdominal: Soft. He exhibits mass (bladder is palpable just below the umbilicus.). There is no tenderness.  Genitourinary:  Genitourinary Comments: Circumcised phallus with adequate meatus.  Scrotum normal with testicular atrophy. AP without lesions. NST. Prostate 1-2+ benign in consistence.  SV's not palpable.   Musculoskeletal: Normal range of motion. He exhibits no edema or tenderness.  Lymphadenopathy:    He has no cervical adenopathy.       Right: No inguinal and no supraclavicular adenopathy present.       Left: No inguinal adenopathy present.  Neurological: He is alert.  Memory is poor and daughter answered most of the questions.   Skin: Skin is warm and dry.    Lab Results:  Recent Labs    02/15/18 1241 02/16/18 0305  WBC 2.5* 3.2*  HGB 10.2* 10.0*  HCT 30.8* 30.8*  PLT 172 170   BMET Recent Labs    02/15/18 1241 02/16/18 0305  NA 137 140  K 3.3* 3.0*  CL 106 108  CO2 18* 22  GLUCOSE 104* 164*  BUN 20 13  CREATININE 0.99 0.86  CALCIUM 9.0 8.9   PT/INR No results for input(s): LABPROT, INR in the last 72 hours. ABG No results for input(s): PHART, HCO3 in the last 72 hours.  Invalid input(s): PCO2, PO2  Studies/Results: Dg Skull 1-3 Views  Result Date: 02/15/2018 CLINICAL DATA:  MRI clearance EXAM: SKULL  - 1-3 VIEW COMPARISON:  None FINDINGS: Metallic dental appliances. Additional small metallic foreign body at RIGHT mandible. No metallic foreign bodies project over the orbits or skull. Visualized paranasal sinuses clear. No acute osseous findings. IMPRESSION: No metallic foreign bodies project over the orbits or skull. Electronically Signed   By: Lavonia Dana  M.D.   On: 02/15/2018 18:37   Dg Chest 2 View  Result Date: 02/15/2018 CLINICAL DATA:  Syncope, history CHF, coronary artery disease, hypertension, diabetes mellitus, former smoker EXAM: CHEST - 2 VIEW COMPARISON:  None FINDINGS: Normal heart size, mediastinal contours, and pulmonary vascularity. Lungs minimally hyperaerated but clear. No pleural effusion or pneumothorax. Bones unremarkable. IMPRESSION: No acute abnormalities. Electronically Signed   By: Lavonia Dana M.D.   On: 02/15/2018 13:21   Mr Brain Wo Contrast  Result Date: 02/15/2018 CLINICAL DATA:  Syncope.  No cardiac symptoms. EXAM: MRI HEAD WITHOUT CONTRAST TECHNIQUE: Multiplanar, multiecho pulse sequences of the brain and surrounding structures were obtained without intravenous contrast. COMPARISON:  None. FINDINGS: Brain: No acute infarction, hemorrhage, hydrocephalus, extra-axial collection or mass lesion. Nodular diffusion hyperintense focus along a right parietal sulcus is attributed to susceptibility artifact from neighboring chronic blood products. Confluent FLAIR hyperintensity in the cerebral white matter attributed to chronic small vessel ischemia given patient's history. Small remote bilateral cerebellar infarcts. Small remote left posterior frontal cortex infarct with mild chronic blood products. Small remote left occipital cortex infarct. Generalized atrophy. Vascular: Major flow voids are preserved. Skull and upper cervical spine: Degenerative disc disease and retro dental ligamentous thickening. Negative for marrow signal abnormality. Sinuses/Orbits: Bilateral cataract  resection. IMPRESSION: 1. No acute finding. 2. Advanced chronic small vessel ischemia. 3. Remote small vessel infarcts in the bilateral cerebellum, left occipital cortex, and left posterior frontal cortex. Electronically Signed   By: Monte Fantasia M.D.   On: 02/15/2018 19:44   I have reviewed the hospital notes and labs as well as care everywhere notes and labs.    Procedure:  Attempted foley placement after betadine prep and urethral instillation with lubricant.   A 74f coude was passed and resistance consistent with stricture encountered at the level of the bulb.   Attempt abandoned.   Assessment: Chronic urinary retention with incontinence.  This is probably long standing and while the bladder is palpable he has no associated pain and normal renal function and is voiding.    Urethral stricture.   Attempt at foley placement met resistance at what felt like the level of the bulb.   Since he is minimally symptomatic and on lovenox, I didn't think bedside cystoscopy with urethral dilation would be appropriate.  I think at this point he needs a renal [place]ato better assess the bladder and possible hydronephrosis and should have a suprapubic tube placed by IR when the lovenox has worn off.   [facility].  His bladder is probably poorly function from chronic dilation with the possibility of diabetic cystopathy.     I have ordered the lovenox held and will put in an order for the SP tube insertion.     CC: Dr. DAlbertina Senegal      JIrine Seal4/15/2019 [id number]

## 2018-02-16 NOTE — Evaluation (Signed)
Occupational Therapy Evaluation Patient Details Name: Elijah Castro MRN: [id number] DOB: 1941-10-08 Today's Date: 02/16/2018    History of Present Illness Pt. is a 77 y.o. M with significant PMH of recently diagnosed pancreatic adenocarcinoma, CVA, CAD, DM2, HLD, HTN, and BPH presenting following an episode of staring and unresponsiveness this morning.    Clinical Impression   Pt admitted with the above diagnosis and has the deficits listed below. Pt would benefit from cont OT to increase independence with basic adls so he can eventually return home to live with his daughters. Pt does not currently have 24/7 S there and is not safe on his feet and is a fall risk.  Pt has the potential to be more independent and may benefit from SNF level of rehab before returning home unless his family can provide 24 hours S.     Follow Up Recommendations  SNF;Supervision/Assistance - 24 hour    Equipment Recommendations  3 in 1 bedside commode    Recommendations for Other Services       Precautions / Restrictions Precautions Precautions: Fall Precaution Comments: pt has fallen at daughters home x1 in bedroom and pt reports he has difficulty getting on and off toilet at times. Restrictions Weight Bearing Restrictions: No      Mobility Bed Mobility Overal bed mobility: Needs Assistance Bed Mobility: Supine to Sit     Supine to sit: Supervision     General bed mobility comments: Reported by MD that pt only needed assist lines to get out of bed.  Transfers Overall transfer level: Needs assistance Equipment used: Rolling walker (2 wheeled) Transfers: Sit to/from Stand Sit to Stand: Min guard         General transfer comment: Patient requiring min guard for sit to stand from recliner to RW and to toilet. Patient has difficulty with eccentric control from stand to sit.     Balance Overall balance assessment: Needs assistance Sitting-balance support: No upper extremity supported;Feet  supported Sitting balance-Leahy Scale: Good     Standing balance support: Bilateral upper extremity supported Standing balance-Leahy Scale: Fair Standing balance comment: Pt can stand without outside support but only for short spurts of time as seen in bathroom.                           ADL either performed or assessed with clinical judgement   ADL Overall ADL's : Needs assistance/impaired Eating/Feeding: Set up;Sitting   Grooming: Wash/dry face;Wash/dry hands;Standing;Supervision/safety Grooming Details (indicate cue type and reason): pt groomed at sink with supervision for balance. Upper Body Bathing: Set up;Sitting;Cueing for safety   Lower Body Bathing: Minimal assistance;Sit to/from stand;Cueing for compensatory techniques;Cueing for safety Lower Body Bathing Details (indicate cue type and reason): Pt sponge bathes at home and requires min assist at times.  Pt requires assist in standing due to poor balance at times. Upper Body Dressing : Set up;Sitting   Lower Body Dressing: Minimal assistance;Sit to/from stand;Cueing for compensatory techniques;Cueing for safety Lower Body Dressing Details (indicate cue type and reason): assist when standing for balance. Toilet Transfer: Minimal assistance;Grab bars;RW;Comfort height toilet Toilet Transfer Details (indicate cue type and reason): PT walked to bathroom with min assist and required min assist to get onto comfort commode.  Put 3:1 over commode to make toileting easier for this pt. Recommend this for home. Toileting- Clothing Manipulation and Hygiene: Minimal assistance;Sit to/from stand;Cueing for safety       Functional mobility during ADLs: Minimal assistance;Rolling  walker General ADL Comments: Pt with some safety concerns. Spoke with pt about calling to get back into bed, but found pt back in bed with chair alarm sounding and IV strung across chair to bed.  Pt said he did not want to sit up but could not remember to  call.  IV and catheter in place.  Nursing aware.  Pt not safe on feet at this time without supervision to min guard.      Vision Baseline Vision/History: No visual deficits Patient Visual Report: No change from baseline       Perception Perception Perception Tested?: Yes   Praxis Praxis Praxis tested?: Within functional limits    Pertinent Vitals/Pain Pain Assessment: No/denies pain     Hand Dominance Right   Extremity/Trunk Assessment Upper Extremity Assessment Upper Extremity Assessment: Overall WFL for tasks assessed   Lower Extremity Assessment Lower Extremity Assessment: Defer to PT evaluation   Cervical / Trunk Assessment Cervical / Trunk Assessment: Normal   Communication Communication Communication: No difficulties   Cognition Arousal/Alertness: Awake/alert Behavior During Therapy: WFL for tasks assessed/performed Overall Cognitive Status: History of cognitive impairments - at baseline                                 General Comments: Patient is cognitively impaired at baseline and no family present to determine change in cognition. Patient able to follow simple commands with increased time. Oriented to month, year, and city of [place] but not hospital. Overall, easily distracted and slow processing speed with poor safety awareness. Explained repeatedly with demonstration on using call bell for assistance back to bed from chair. However, after end of session, patient returned to bed unassisted from chair.   General Comments  Pt generally unsafe on feet without assist. Called daughter who states she works 8 hours a day and pt is alone during the day.  She was hoping for a HHAid but did let her know that this service does not cover 24/7 S.      Exercises     Shoulder Instructions      Home Living Family/patient expects to be discharged to:: Private residence Living Arrangements: Children Available Help at Discharge: Family;Available  PRN/intermittently Type of Home: House Home Access: Stairs to enter [employer] of Steps: 3 Entrance Stairs-Rails: Right Home Layout: One level     Bathroom Shower/Tub: Other (comment)(does not bathe in shower or tub.  Sponge bathes only)   Bathroom Toilet: Standard     Home Equipment: Cane - single point;Walker - 4 wheels;Shower seat   Additional Comments: Patient reports he is by himself during the day. He reports going between his two daughter's house in [place] and [place]. Patient's house in [place] has level entry but the home in [place] has 3 steps to enter with R railing.      Prior Functioning/Environment Level of Independence: Needs assistance  Gait / Transfers Assistance Needed: Uses Rollator ADL's / Homemaking Assistance Needed: Daughters help with IADL's including housework, cooking, and paying bills. Patient daughter also states she occasionally helps with dressing and bathing.   Comments: Patient has had urinary and fecal incontinence for approximately a year. Had fall at home last week over box of Depends.          OT Problem List: Impaired balance (sitting and/or standing);Decreased cognition;Decreased safety awareness;Decreased knowledge of precautions      OT Treatment/Interventions: Self-care/ADL training;Therapeutic activities;DME and/or AE instruction  OT Goals(Current goals can be found in the care plan section) Acute Rehab OT Goals Patient Stated Goal: Sit down on toilet easier OT Goal Formulation: With patient Time For Goal Achievement: 03/02/18 Potential to Achieve Goals: Good ADL Goals Pt Will Perform Grooming: with supervision;standing Pt Will Perform Lower Body Bathing: with supervision;sit to/from stand Pt Will Perform Lower Body Dressing: with supervision;sit to/from stand Additional ADL Goal #1: Pt will walk to bathroom with rollator and toilet/change depends and manage clothing with Supervision.  OT Frequency: Min  2X/week   Barriers to D/C: Decreased caregiver support  Lives with daughters but they work during the day.       Co-evaluation PT/OT/SLP Co-Evaluation/Treatment: Yes Reason for Co-Treatment: Necessary to address cognition/behavior during functional activity PT goals addressed during session: Mobility/safety with mobility OT goals addressed during session: ADL's and self-care      AM-PAC PT "6 Clicks" Daily Activity     Outcome Measure Help from another person eating meals?: None Help from another person taking care of personal grooming?: None Help from another person toileting, which includes using toliet, bedpan, or urinal?: A Little Help from another person bathing (including washing, rinsing, drying)?: A Little Help from another person to put on and taking off regular upper body clothing?: A Little Help from another person to put on and taking off regular lower body clothing?: A Little 6 Click Score: 20   End of Session Equipment Utilized During Treatment: Gait belt Nurse Communication: Mobility status;Other (comment)(need for close Supervision as pt is a safety risk)  Activity Tolerance: Patient tolerated treatment well Patient left: in chair;with call bell/phone within reach;with chair alarm set  OT Visit Diagnosis: Unsteadiness on feet (R26.81);Repeated falls (R29.6);History of falling (Z91.81)                Time: 4174-0814 OT Time Calculation (min): 38 min Charges:  OT General Charges $OT Visit: 1 Visit OT Evaluation $OT Eval Moderate Complexity: 1 Mod G-Codes:     Jinger Neighbors, OTR/L [phone]  Glenford Peers 02/16/2018, 12:14 PM

## 2018-02-16 NOTE — Evaluation (Signed)
Physical Therapy Evaluation Patient Details Name: Elijah Castro MRN: [id number] DOB: 12-12-1940 Today's Date: 02/16/2018   History of Present Illness  Pt. is a 77 y.o. M with significant PMH of recently diagnosed pancreatic adenocarcinoma, CVA, CAD, DM2, HLD, HTN, and BPH presenting following an episode of staring and unresponsiveness this morning.   Clinical Impression  Pt admitted with above diagnosis. Upon PT evaluation, patient presents with decreased functional mobility and safety awareness secondary to cognitive deficits, diminished balance, generalized weakness, and gait deviations. Unable to determine baseline due to family member not being present. Contacted patient daughter following session. She stated he is alone for 8 hours daily and family will not be able to provide 24/7 assist. Therefore, recommending SNF due to patient presenting as a fall risk. Patient had an unwitnessed fall last week, tripping over a box of Depends. Would like to trial Rollator vs RW since patient states he used a Rollator at baseline. Will continue acute skilled PT to address deficits and increase mobility.     Follow Up Recommendations SNF;Supervision/Assistance - 24 hour    Equipment Recommendations  None recommended by PT    Recommendations for Other Services       Precautions / Restrictions Precautions Precautions: Fall Restrictions Weight Bearing Restrictions: No      Mobility  Bed Mobility               General bed mobility comments: Patient OOB in recliner upon entrance  Transfers Overall transfer level: Needs assistance Equipment used: Rolling walker (2 wheeled) Transfers: Sit to/from Stand Sit to Stand: Min guard         General transfer comment: Patient requiring min guard for sit to stand from recliner to RW and to toilet. Patient has difficulty with eccentric control from stand to sit.   Ambulation/Gait Ambulation/Gait assistance: Min guard Ambulation Distance (Feet):  100 Feet Assistive device: Rolling walker (2 wheeled) Gait Pattern/deviations: Narrow base of support;Decreased dorsiflexion - right;Decreased dorsiflexion - left;Step-through pattern Gait velocity: 0.70 m/s Gait velocity interpretation: <1.31 ft/sec, indicative of household ambulator General Gait Details: Patient with poor proximity to RW and not able to correct with verbal cueing. Displays narrow BOS with decreased bilateral foot clearance and R foot supination. Decreased gait speed with dual tasking  Stairs            Wheelchair Mobility    Modified Rankin (Stroke Patients Only)       Balance Overall balance assessment: Needs assistance Sitting-balance support: No upper extremity supported;Feet supported Sitting balance-Leahy Scale: Good     Standing balance support: Bilateral upper extremity supported Standing balance-Leahy Scale: Fair                               Pertinent Vitals/Pain Pain Assessment: No/denies pain    Home Living Family/patient expects to be discharged to:: Private residence Living Arrangements: Children(Daughters) Available Help at Discharge: Family;Available PRN/intermittently Type of Home: House Home Access: Stairs to enter Entrance Stairs-Rails: Right Entrance Stairs-Number of Steps: 3 Home Layout: One level Home Equipment: Cane - single point;Walker - 4 wheels;Shower seat Additional Comments: Patient reports he is by himself during the day. He reports going between his two daughter's house in [place] and [place]. Patient's house in [place] has level entry but the home in [place] has 3 steps to enter with R railing.    Prior Function Level of Independence: Needs assistance   Gait / Transfers Assistance Needed: Uses Rollator  ADL's / Homemaking Assistance Needed: Daughters help with IADL's including housework, cooking, and paying bills. Patient daughter also states she occasionally helps with dressing and  bathing.  Comments: Patient has had urinary and fecal incontinence for approximately a year. Had fall at home last week over box of Depends.       Hand Dominance        Extremity/Trunk Assessment   Upper Extremity Assessment Upper Extremity Assessment: Defer to OT evaluation    Lower Extremity Assessment Lower Extremity Assessment: Generalized weakness;Difficult to assess due to impaired cognition       Communication   Communication: No difficulties  Cognition Arousal/Alertness: Awake/alert Behavior During Therapy: WFL for tasks assessed/performed Overall Cognitive Status: History of cognitive impairments - at baseline                                 General Comments: Patient is cognitively impaired at baseline and no family present to determine change in cognition. Patient able to follow simple commands with increased time. Oriented to month, year, and city of [place] but not hospital. Overall, easily distracted and slow processing speed with poor safety awareness. Explained repeatedly with demonstration on using call bell for assistance back to bed from chair. However, after end of session, patient returned to bed unassisted from chair.      General Comments      Exercises     Assessment/Plan    PT Assessment Patient needs continued PT services  PT Problem List Decreased strength;Decreased activity tolerance;Decreased balance;Decreased mobility;Decreased cognition;Decreased knowledge of use of DME;Decreased safety awareness       PT Treatment Interventions DME instruction;Gait training;Stair training;Functional mobility training;Therapeutic activities;Therapeutic exercise;Balance training;Patient/family education    PT Goals (Current goals can be found in the Care Plan section)  Acute Rehab PT Goals Patient Stated Goal: Sit down on toilet easier PT Goal Formulation: With patient Time For Goal Achievement: 03/02/18 Potential to Achieve Goals:  Good    Frequency Min 2X/week   Barriers to discharge Decreased caregiver support      Co-evaluation PT/OT/SLP Co-Evaluation/Treatment: Yes Reason for Co-Treatment: Necessary to address cognition/behavior during functional activity;To address functional/ADL transfers           AM-PAC PT "6 Clicks" Daily Activity  Outcome Measure Difficulty turning over in bed (including adjusting bedclothes, sheets and blankets)?: None Difficulty moving from lying on back to sitting on the side of the bed? : None Difficulty sitting down on and standing up from a chair with arms (e.g., wheelchair, bedside commode, etc,.)?: A Little Help needed moving to and from a bed to chair (including a wheelchair)?: A Little Help needed walking in hospital room?: A Little Help needed climbing 3-5 steps with a railing? : A Little 6 Click Score: 20    End of Session Equipment Utilized During Treatment: Gait belt Activity Tolerance: Patient tolerated treatment well Patient left: in chair;with call bell/phone within reach;with chair alarm set Nurse Communication: Mobility status PT Visit Diagnosis: Unsteadiness on feet (R26.81);History of falling (Z91.81);Muscle weakness (generalized) (M62.81);Difficulty in walking, not elsewhere classified (R26.2)    Time: 5102-5852 PT Time Calculation (min) (ACUTE ONLY): 30 min   Charges:   PT Evaluation $PT Eval Moderate Complexity: 1 Mod     PT G Codes:        Ellamae Sia, PT, DPT Acute Rehabilitation Services  Pager: [place] 02/16/2018, 11:45 AM

## 2018-02-17 ENCOUNTER — Encounter (HOSPITAL_COMMUNITY): Payer: Self-pay | Admitting: Family

## 2018-02-17 ENCOUNTER — Other Ambulatory Visit: Payer: Self-pay

## 2018-02-17 ENCOUNTER — Inpatient Hospital Stay (HOSPITAL_COMMUNITY): Payer: Medicare Other

## 2018-02-17 DIAGNOSIS — R339 Retention of urine, unspecified: Secondary | ICD-10-CM | POA: Clinically undetermined

## 2018-02-17 DIAGNOSIS — R338 Other retention of urine: Secondary | ICD-10-CM

## 2018-02-17 DIAGNOSIS — Z960 Presence of urogenital implants: Secondary | ICD-10-CM

## 2018-02-17 DIAGNOSIS — N35919 Unspecified urethral stricture, male, unspecified site: Secondary | ICD-10-CM

## 2018-02-17 DIAGNOSIS — D709 Neutropenia, unspecified: Secondary | ICD-10-CM

## 2018-02-17 DIAGNOSIS — N39498 Other specified urinary incontinence: Secondary | ICD-10-CM

## 2018-02-17 DIAGNOSIS — E1169 Type 2 diabetes mellitus with other specified complication: Secondary | ICD-10-CM

## 2018-02-17 LAB — GLUCOSE, CAPILLARY
GLUCOSE-CAPILLARY: 182 mg/dL — AB (ref 65–99)
Glucose-Capillary: 202 mg/dL — ABNORMAL HIGH (ref 65–99)
Glucose-Capillary: 212 mg/dL — ABNORMAL HIGH (ref 65–99)
Glucose-Capillary: 135 mg/dL — ABNORMAL HIGH (ref 65–99)

## 2018-02-17 LAB — BASIC METABOLIC PANEL
Anion gap: 9 (ref 5–15)
BUN: 7 mg/dL (ref 6–20)
CHLORIDE: 108 mmol/L (ref 101–111)
CO2: 20 mmol/L — ABNORMAL LOW (ref 22–32)
CREATININE: 0.73 mg/dL (ref 0.61–1.24)
Calcium: 8.5 mg/dL — ABNORMAL LOW (ref 8.9–10.3)
GFR calc Af Amer: >60 mL/min (ref >60)
Glucose, Bld: 224 mg/dL — ABNORMAL HIGH (ref 65–99)
POTASSIUM: 3.7 mmol/L (ref 3.5–5.1)
Sodium: 137 mmol/L (ref 135–145)

## 2018-02-17 LAB — PROTIME-INR
INR: 1.16
PROTHROMBIN TIME: 14.7 s (ref 11.4–15.2)

## 2018-02-17 MED ORDER — MIDAZOLAM HCL 2 MG/2ML IJ SOLN
INTRAMUSCULAR | Status: AC | PRN
  Administered 2018-02-17: 1 mg via INTRAVENOUS

## 2018-02-17 MED ORDER — INSULIN GLARGINE 100 UNIT/ML ~~LOC~~ SOLN
3.0000 [IU] | Freq: Every day | SUBCUTANEOUS | Status: DC
  Administered 2018-02-17: 3 [IU] via SUBCUTANEOUS
  Filled 2018-02-17: qty 0.03

## 2018-02-17 MED ORDER — LIDOCAINE HCL 1 % IJ SOLN
INTRAMUSCULAR | Status: AC
Start: 2018-02-17 — End: 2018-02-18
  Filled 2018-02-17: qty 20

## 2018-02-17 MED ORDER — CEFAZOLIN SODIUM-DEXTROSE 2-4 GM/100ML-% IV SOLN
2.0000 g | Freq: Once | INTRAVENOUS | Status: AC
  Administered 2018-02-17: 2 g via INTRAVENOUS
  Filled 2018-02-17: qty 100

## 2018-02-17 MED ORDER — METFORMIN HCL 500 MG PO TABS
500.0000 mg | ORAL_TABLET | Freq: Two times a day (BID) | ORAL | Status: DC
  Administered 2018-02-17 – 2018-02-19 (×5): 500 mg via ORAL
  Filled 2018-02-17 (×5): qty 1

## 2018-02-17 MED ORDER — FENTANYL CITRATE (PF) 100 MCG/2ML IJ SOLN
INTRAMUSCULAR | Status: AC
  Filled 2018-02-17: qty 2

## 2018-02-17 MED ORDER — MIDAZOLAM HCL 2 MG/2ML IJ SOLN
INTRAMUSCULAR | Status: AC
  Filled 2018-02-17: qty 2

## 2018-02-17 MED ORDER — CEFAZOLIN SODIUM-DEXTROSE 2-4 GM/100ML-% IV SOLN
INTRAVENOUS | Status: AC
  Administered 2018-02-17: 2 g via INTRAVENOUS
  Filled 2018-02-17: qty 100

## 2018-02-17 MED ORDER — INSULIN ASPART 100 UNIT/ML ~~LOC~~ SOLN
3.0000 [IU] | Freq: Three times a day (TID) | SUBCUTANEOUS | Status: DC
  Administered 2018-02-17 – 2018-02-18 (×3): 3 [IU] via SUBCUTANEOUS

## 2018-02-17 MED ORDER — FENTANYL CITRATE (PF) 100 MCG/2ML IJ SOLN
INTRAMUSCULAR | Status: AC | PRN
  Administered 2018-02-17: 50 ug via INTRAVENOUS

## 2018-02-17 NOTE — Progress Notes (Signed)
Inpatient Diabetes Program Recommendations  AACE/ADA: New Consensus Statement on Inpatient Glycemic Control (2015)  Target Ranges:  Prepandial:   less than 140 mg/dL      Peak postprandial:   less than 180 mg/dL (1-2 hours)      Critically ill patients:  140 - 180 mg/dL   Lab Results  Component Value Date   GLUCAP 202 (H) 02/17/2018   HGBA1C 8.5 (H) 02/16/2018   Results for WAYLON, HERSHEY (MRN [id number]) as of 02/17/2018 09:22  Ref. Range 02/16/2018 06:10 02/16/2018 11:45 02/16/2018 16:39 02/16/2018 20:46 02/17/2018 06:11  Glucose-Capillary Latest Ref Range: 65 - 99 mg/dL 160 (H) 194 (H) 212 (H) 274 (H) 202 (H)   Review of Glycemic Control  Diabetes history: DM 2 Outpatient Diabetes medications: Novolog 5-10 units tid before meals, Lantus 20 units (reported as not taking) Current orders for Inpatient glycemic control: Lantus 3 units Daily  Inpatient Diabetes Program Recommendations:    Looking at glucose trend and based on patient weight, 77.5 kg, consider ordering Lantus 8 units (0.1 units/kg). Also consider Novolog Sensitive Correction 0-9 units tid + Novolog HS scale per the glycemic control order set.  Thanks,  Tama Headings RN, MSN, BC-ADM, [facility] Inpatient Diabetes Coordinator Team Pager [phone] (8a-5p)

## 2018-02-17 NOTE — Progress Notes (Signed)
Subjective: Elijah Castro denied any pain or discomfort this morning. It was explained to him that he needed a suprapubic catheter to drain his distended bladder. He showed some understanding and did not have any objections.   On 4/15, he was found on postvoid bladder scan to be retaining ~1L. A foley catheter was unable to be advanced by nursing or urology, who suspect a urethral stricture. He received a renal ultrasound showing no evidence of hydronephrosis and is scheduled to receive a suprapubic catheter with IR.   Objective:  Vital signs in last 24 hours: Vitals:   02/16/18 1146 02/16/18 1245 02/16/18 2049 02/17/18 0536  BP: 130/66  (!) 145/71 (!) 136/55  Pulse: 76  94 82  Resp: 16  17 17   Temp:  98.2 F (36.8 C) 98.2 F (36.8 C) (!) 97.5 F (36.4 C)  TempSrc:   Oral Oral  SpO2:   100% 100%  Weight:      Height:       Physical Exam: General Appearance: Well developed, well nourished, in no acute distress resting in bed. Lungs: Normal WOB without any wheezing or crackles. Cardiovascular: Normal rate, regular rhythm, no m/r/g. Peripheral pulses 1+ and symmetric. Abdomen: Suprapubic fullness to the umbilicus, nontender with normal bowel sounds Extremities: No cyanosis, clubbing or edema.  Labs: CMP: K 3.7, bicarb 20, glucose 224  Imaging: 4/14 MRI brain wo contrast: 1. No acute finding. 2. Advanced chronic small vessel ischemia. 3. Remote small vessel infarcts in the bilateral cerebellum, left occipital cortex, and left posterior frontal cortex.  4/15 renal US: 1. No evidence of hydronephrosis. 2. Mildly increased renal parenchymal echogenicity raises concern for medical renal disease. 3. Mildly distended bladder, raising question for bladder outlet obstruction. Would correlate for associated symptoms.  Assessment/Plan:  Active Problems:   Altered mental status  Elijah Castro is a 77 y.o. M with PMH recently diagnosed pancreatic adenocarcinoma, CVA, CAD, DM2, HLD,  HTN and BPH presenting with transient altered mental status manifested by staring and unresponsiveness with no acute process visualized on MRI. He is currently at his baseline level of functioning. On 4/15 he was found to be retaining urine and is scheduled to receive a suprapubic catheter.  Transient AMS: Episodic symptoms with poor oral intake and positive orthostatic vitals suggest syncope in the setting of dehydration. Differential includes transient hypoglycemia given poor intake. Stroke and CNS mets ruled out with MRI brain. His urinary retention could be causative of AMS but is likely a chronic process that would not typically follow this episodic pattern. No significant metabolic derangements on lab evaluation. Seizure may also be considered; however, EEG would be low yield given pt has been at baseline since admission. No chest pain or consistent arrhythmia to suggest cardiac etiology.  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 UA unremarkable. Renal US with no hydronephrosis, no significant electrolyte derangements or signs of AKI. Suprapubic catheter today with outpatient evaluation at treatment of stricture at [facility] upon discharge.  - continue terazosin 2 mg po qd and finasteride 5 mg po qd  History of CVA: MRI confirms small vessel of ischemia and remote infarcts. No focal neurological deficits deviating from baseline at this time. - continue ASA 81 mg po qd, atorvastatin 80 mg po qd  Pancreatic adenocarcinoma: Candidate for resection and neoadjuvant chemotherapy per  [facility] notes. Takes megestrol for appetite stimulation, but has limited efficacy and may be contributing to AMS. - hold megestrol  DM2: A1c 8.7. Glucose >200 over last 24h. Patient has poor oral  intake at baseline, so we would like to be sparing with insulin.  - lantus 3 u x1  - restart novolog 3 u with meals  Mild neutropenia: WBC 3.2, ANC 1.1. Likely acute given WBC 6.4 in 10/2017, making stable ethnic neutropenia less likely. Favor malnutrition given poor po intake. Current meds not consistent with agranulocytosis. No infectious symptoms. - check smear  FEN/GI: F: hold given urinary obstruction E: replete as needed N: carb modified GI: none needed at this time  DVT PPX: Lovenox held in setting of SP tube insertion  Dispo: Anticipated discharge in approximately 1 day(s) to SNF. He will not be eligible for [facility] SNF until his existing Medicare coverage is utilized.   Sarina Ser, Medical Student 02/17/2018, 8:09 AM Pager: [phone]  Attestation for Student Documentation:  I personally was present and performed or re-performed the history, physical exam and medical decision-making activities of this service and have verified that the service and findings are accurately documented in the student's note.  Ledell Noss, MD 02/18/2018, 6:54 AM

## 2018-02-17 NOTE — Sedation Documentation (Signed)
Patient is resting comfortably. 

## 2018-02-17 NOTE — NC FL2 (Signed)
  [place] LEVEL OF CARE SCREENING TOOL     IDENTIFICATION  Patient Name: Elijah Castro Birthdate: 17-Jul-1941 Sex: male Admission Date (Current Location): 02/15/2018  [facility] and [place] Number:  Herbalist and Address:  The [place], [place] [address] [place], [place] [address]      Provider Number: [id number]  Attending Physician Name and Address:  Axel Filler, *  Relative Name and Phone Number:  Albertus Chiarelli, [phone]    Current Level of Care: Hospital Recommended Level of Care: [place] Prior Approval Number:    Date Approved/Denied:   PASRR Number: [id number]  Discharge Plan: SNF    Current Diagnoses: Patient Active Problem List   Diagnosis Date Noted  . Altered mental status 02/15/2018  . Hyperglycemia 05/07/2016    Orientation RESPIRATION BLADDER Height & Weight     Self, Time, Situation, Place  Normal Incontinent, External catheter Weight: 170 lb 13.7 oz (77.5 kg) Height:  5\' 8"  (172.7 cm)  BEHAVIORAL SYMPTOMS/MOOD NEUROLOGICAL BOWEL NUTRITION STATUS      Continent Diet(see dc summary)  AMBULATORY STATUS COMMUNICATION OF NEEDS Skin   Limited Assist Verbally                         Personal Care Assistance Level of Assistance  Bathing, Feeding, Dressing Bathing Assistance: Limited assistance Feeding assistance: Independent Dressing Assistance: Limited assistance     Functional Limitations Info  Sight, Hearing, Speech Sight Info: Adequate Hearing Info: Adequate Speech Info: Adequate    SPECIAL CARE FACTORS FREQUENCY  PT (By licensed PT), OT (By licensed OT)     PT Frequency: 5x wk OT Frequency: 5x wk            Contractures Contractures Info: Not present    Additional Factors Info  Code Status, Allergies Code Status Info: full code Allergies Info: No known allergies           Current Medications (02/17/2018):  This is the current hospital active  medication list Current Facility-Administered Medications  Medication Dose Route Frequency Provider Last Rate Last Dose  . 0.9 %  sodium chloride infusion   Intravenous Continuous Ledell Noss, MD 125 mL/hr at 02/17/18 [phone]    . acetaminophen (TYLENOL) tablet 1,000 mg  1,000 mg Oral Q6H PRN Ledell Noss, MD      . aspirin chewable tablet 81 mg  81 mg Oral Daily Ledell Noss, MD   81 mg at 02/16/18 0836  . atorvastatin (LIPITOR) tablet 40 mg  40 mg Oral QHS Ledell Noss, MD   40 mg at 02/16/18 2156  . finasteride (PROSCAR) tablet 5 mg  5 mg Oral Daily Jule Ser, DO   5 mg at 02/16/18 1242  . lidocaine (XYLOCAINE) 2 % jelly 1 application  1 application Urethral Once Axel Filler, MD      . terazosin (HYTRIN) capsule 2 mg  2 mg Oral QHS Kathi Ludwig, MD   2 mg at 02/16/18 2156     Discharge Medications: Please see discharge summary for a list of discharge medications.  Relevant Imaging Results:  Relevant Lab Results:   Additional Information SS# [id number]  Wende Neighbors, LCSW

## 2018-02-17 NOTE — Discharge Summary (Signed)
Name: Arnold Kester MRN: [id number] DOB: [birth year] 77 y.o. PCP: System, Pcp Not In  Date of Admission: 02/15/2018 12:17 PM Date of Discharge:  Attending Physician: Oda Kilts, MD  Discharge Diagnosis: Principal Problem:   Altered mental status Active Problems:   Hyperglycemia   Urinary retention   Discharge Medications: Allergies as of 02/19/2018   No Known Allergies     Medication List    STOP taking these medications   clopidogrel 75 MG tablet Commonly known as:  PLAVIX   finasteride 5 MG tablet Commonly known as:  PROSCAR   insulin glargine 100 UNIT/ML injection Commonly known as:  LANTUS   megestrol 20 MG tablet Commonly known as:  MEGACE   metoprolol tartrate 25 MG tablet Commonly known as:  LOPRESSOR   NOVOLOG FLEXPEN [place]   terazosin 2 MG capsule Commonly known as:  HYTRIN     TAKE these medications   acetaminophen 500 MG tablet Commonly known as:  TYLENOL Take 1,000 mg by mouth as needed for mild pain.   aspirin 81 MG chewable tablet Chew 81 mg by mouth daily.   atorvastatin 80 MG tablet Commonly known as:  LIPITOR Take 40 mg by mouth at bedtime.   lisinopril 5 MG tablet Commonly known as:  PRINIVIL,ZESTRIL Take 1 tablet (5 mg total) by mouth daily. What changed:    medication strength  how much to take   metFORMIN 500 MG tablet Commonly known as:  GLUCOPHAGE Take 1 tablet (500 mg total) by mouth 2 (two) times daily with a meal.   ondansetron 4 MG disintegrating tablet Commonly known as:  ZOFRAN-ODT Take 4 mg by mouth every 6 (six) hours as needed for nausea/vomiting.            Durable Medical Equipment  (From admission, onward)        Start     Ordered   02/18/18 1040  For home use only DME 3 n 1  Once     02/18/18 1039      Disposition and follow-up:   Mr.Clarnce Seward was discharged from [facility] in Stable condition.  At the hospital follow up visit please address:  1.  Transient  altered mental status: - encourage po intake of fluids and protein-rich supplements - avoid deliriogenic and dizziness-provoking medications  Urinary retention with incontinence: - pt due for change of suprapubic catheter in six weeks (end of May) - [place] urology: evaluate suspected urethral stricture  Pancreatic adenocarcinoma: - ensure follow-up with oncology and surgery for planned resection with neoadjuvant chemotherapy  Normal anion gap metabolic acidosis with hypokalemia: - recheck BMP at follow-up  DM2: - continue metformin - we recommend avoiding insulin as pt is at high risk of hypoglycemia given poor oral intake  2.  Labs / imaging needed at time of follow-up: none  3.  Pending labs/ test needing follow-up: none  Follow-up Appointments: Follow-up Information    [facility], [facility] Follow up.   Specialty:  [place] Why:  [place] RN/PT/OT/aide/SW arranged- they will call you to set up home visits and then transition to [facility] when pt goes there Contact information: [address] [place] [address] [phone]        [facility] - Dme Follow up.   Why:  3n1 arranged- to be delivered to room prior to discharge Contact information: [place] [address] [phone]         [facility]  for [facility] 4/22 PCP appointment with Dr. Candiss Norse 4/23 11:30 AM  Hospital Course by problem list: Principal Problem:   Altered mental status Active Problems:   Hyperglycemia   Urinary retention   Mr. Casanova is a 77 y.o. M with PMH recently diagnosed pancreatic adenocarcinoma, dementia, CVA, CAD, DM2, HLD, HTN and BPH presenting with transient altered mental status manifested by staring and unresponsiveness with no acute process visualized on MRI.  1. Transient altered mental status: According to patient's daughter, he had a 2 minute episode of staring and unresponsiveness while sitting in church. He was  found to have positive orthostatic vitals with no evidence of focal neurological impairment on physical exam, raising suspicion for syncope. Laboratory evaluation revealed no hypoglycemia or other significant metabolic derangements. MRI brain revealed no acute pathology. EEG was not performed as patient remained at baseline during hospitalization. Pt was managed with IV fluid resuscitation and optimization of medications - namely, discontinuation of metoprolol, terazosin, finasteride, insulin and megestrol. He was at his baseline throughout hospitalization.   Urinary retention with overflow incontinence: Mr. Sotomayor was found to be retaining 1L urine on bladder scan. Catheterization was unable to be performed. Urology was consulted and suspected urethral stricture. Renal US 4/16 showed no hydronephrosis. Pt underwent placement of suprapubic catheter on 4/16. He will require outpatient evaluation and management of urethral stricture at [facility] follow-up. The catheter will need to be changed in 6 weeks.   Normal anion gap metabolic acidosis with hypokalemia: BMP showed slight acidosis with hypokalemia, likely a combination of diuresis following relief of urinary obstruction and dehydration given extremely poor po intake. He received maintenance IV fluid resuscitation. Electrolytes were supplemented as needed.  History of CVA: MRI brain showed small vessel ischemia and remote infarcts. Mr. Karl was assessed by Physical Therapy and found to be a fall risk requiring 24/7 supervision which his daughter said they would be able to provide with the help of home health. He was continued on home ASA and atorvastatin.   DM2: Mr. Ohlin was euglycemic on arrival to hospital. A1c 8.5. Given poor oral intake, his insulin was discontinued and he was started on metformin. His blood glucose remained <200 while on metformin.   Pancreatic adenocarcinoma: Mr. Nepomuceno is a candidate for resection and neoadjuvant [facility]. He is scheduled to receive port placement 4/22 in [place].    Discharge Vitals:   BP (!) 128/59   Pulse 77   Temp 98.1 F (36.7 C) (Oral)   Resp 17   Ht 5' 8.5" (1.74 m)   Wt 170 lb 6.7 oz (77.3 kg)   SpO2 100%   BMI 25.53 kg/m   Pertinent Labs, Studies, and Procedures:  4/14 MRI brain wo contrast: 1. No acute finding. 2. Advanced chronic small vessel ischemia. 3. Remote small vessel infarcts in the bilateral cerebellum, left occipital cortex, and left posterior frontal cortex.  4/15 renal US: 1. No evidence of hydronephrosis. 2. Mildly increased renal parenchymal echogenicity raises concern for medical renal disease.  Discharge Instructions: Discharge Instructions    Call MD for:  persistant dizziness or light-headedness   Complete by:  As directed    Call MD for:  persistant nausea and vomiting   Complete by:  As directed    Diet - low sodium heart healthy   Complete by:  As directed    Increase activity slowly   Complete by:  As directed       Signed: Ledell Noss, MD 02/19/2018, 1:04  PM   Pager: [phone]

## 2018-02-17 NOTE — Progress Notes (Signed)
Urology Inpatient Progress Report  Syncope and collapse [R55] Encounter for imaging to screen for metal prior to magnetic resonance imaging (MRI) [Z13.89]        Intv/Subj: No acute events overnight. Patient is without complaint. Patient had his suprapubic catheter placed today.  It is draining well.  Principal Problem:   Altered mental status Active Problems:   Hyperglycemia   Urinary retention  Current Facility-Administered Medications  Medication Dose Route Frequency Provider Last Rate Last Dose  . acetaminophen (TYLENOL) tablet 1,000 mg  1,000 mg Oral Q6H PRN Ledell Noss, MD      . aspirin chewable tablet 81 mg  81 mg Oral Daily Ledell Noss, MD   81 mg at 02/17/18 1100  . atorvastatin (LIPITOR) tablet 40 mg  40 mg Oral QHS Ledell Noss, MD   40 mg at 02/16/18 2156  . fentaNYL (SUBLIMAZE) 100 MCG/2ML injection           . finasteride (PROSCAR) tablet 5 mg  5 mg Oral Daily Jule Ser, DO   5 mg at 02/17/18 1100  . insulin aspart (novoLOG) injection 3 Units  3 Units Subcutaneous TID WC Ledell Noss, MD   3 Units at 02/17/18 1812  . lidocaine (XYLOCAINE) 1 % (with pres) injection           . lidocaine (XYLOCAINE) 2 % jelly 1 application  1 application Urethral Once Axel Filler, MD      . metFORMIN (GLUCOPHAGE) tablet 500 mg  500 mg Oral BID WC Ledell Noss, MD   500 mg at 02/17/18 1812  . midazolam (VERSED) 2 MG/2ML injection           . terazosin (HYTRIN) capsule 2 mg  2 mg Oral QHS Kathi Ludwig, MD   2 mg at 02/16/18 2156     Objective: Vital: Vitals:   02/17/18 1530 02/17/18 1558 02/17/18 1600 02/17/18 2037  BP: (!) 149/68  (!) 142/82 124/62  Pulse:  95 97 82  Resp: 11 20 (!) 21 16  Temp:  (!) 97.4 F (36.3 C)  97.9 F (36.6 C)  TempSrc:  Oral  Oral  SpO2:  100% 100% 96%  Weight:      Height:       I/Os: I/O last 3 completed shifts: In: 360 [P.O.:360] Out: 2050 [Urine:1050; Drains:1000]  Physical Exam:  General: Patient is in no apparent  distress Lungs: Normal respiratory effort, chest expands symmetrically. GI: The abdomen is soft and nontender without mass. Foley: Suprapubic catheter in place draining clear yellow urine  Lab Results: Recent Labs    02/15/18 1241 02/16/18 0305  WBC 2.5* 3.2*  HGB 10.2* 10.0*  HCT 30.8* 30.8*   Recent Labs    02/15/18 1241 02/16/18 0305 02/17/18 0733  NA 137 140 137  K 3.3* 3.0* 3.7  CL 106 108 108  CO2 18* 22 20*  GLUCOSE 104* 164* 224*  BUN 20 13 7   CREATININE 0.99 0.86 0.73  CALCIUM 9.0 8.9 8.5*   Recent Labs    02/17/18 0733  INR 1.16   No results for input(s): LABURIN in the last 72 hours. Results for orders placed or performed during the hospital encounter of 05/06/16  Urine culture     Status: Abnormal   Collection Time: 05/06/16 10:38 PM  Result Value Ref Range Status   Specimen Description URINE, RANDOM  Final   Special Requests NONE  Final   Culture MULTIPLE SPECIES PRESENT, SUGGEST RECOLLECTION (A)  Final   Report  Status 05/08/2016 FINAL  Final    Studies/Results: US Renal  Result Date: 02/16/2018 CLINICAL DATA:  Chronic urinary retention. EXAM: RENAL / URINARY TRACT ULTRASOUND COMPLETE COMPARISON:  None. FINDINGS: Right Kidney: Length: 10.5 cm. Mildly increased parenchymal echogenicity is noted. No mass or hydronephrosis visualized. Left Kidney: Length: 11.5 cm. Mildly increased parenchymal echogenicity is noted. No mass or hydronephrosis visualized. Bladder: The bladder is markedly distended. IMPRESSION: 1. No evidence of hydronephrosis. 2. Mildly increased renal parenchymal echogenicity raises concern for medical renal disease. 3. Mildly distended bladder, raising question for bladder outlet obstruction. Would correlate for associated symptoms. Electronically Signed   By: Garald Balding M.D.   On: 02/16/2018 23:29   Ct Image Guided Drainage By Percutaneous Catheter  Result Date: 02/17/2018 INDICATION: 77 year old male with bladder outlet obstruction  secondary to a urethral stricture. Recent unsuccessful attempt at Foley catheter placement. Patient presents at the request of urology for suprapubic catheter placement. EXAM: CT-guided placement of a suprapubic catheter COMPARISON:  Renal ultrasound 02/16/2018 MEDICATIONS: 2 g Ancef; The antibiotic was administered in an appropriate time frame prior to skin puncture. ANESTHESIA/SEDATION: Fentanyl 50 mcg IV; Versed 1 mg IV Moderate Sedation Time:  21 minutes The patient was continuously monitored during the procedure by the interventional radiology nurse under my direct supervision. CONTRAST:  None FLUOROSCOPY TIME:  Fluoroscopy Time: 0 minutes 0 seconds (0 mGy). COMPLICATIONS: None immediate. PROCEDURE: Informed written consent was obtained from the patient after a thorough discussion of the procedural risks, benefits and alternatives. All questions were addressed. Maximal Sterile Barrier Technique was utilized including caps, mask, sterile gowns, sterile gloves, sterile drape, hand hygiene and skin antiseptic. A timeout was performed prior to the initiation of the procedure. A planning axial CT scan was performed. The distended bladder is successfully identified. A suitable spot in the midline low anterior abdominal wall was selected and the overlying skin marked. Following sterile prep and drape in the standard fashion with chlorhexidine skin prep, local anesthesia was attained by infiltration with 1% lidocaine. A small dermatotomy was made. Using intermittent CT guidance, an 18 gauge trocar needle was advanced through the median raphe between the rectus abdominus muscles and into the bladder. Removal of the stylet demonstrates release of clear yellow urine. A 0.035 wire was advanced to the needle and coiled in the bladder. Skin tract was then dilated to 14 [place] and a flexible 14 [place] all-purpose drainage catheter was advanced over the wire and formed in the bladder. The catheter was connected to gravity bag  drainage and sutured to the skin with 0 Prolene suture. A follow-up axial CT scan demonstrates a well-positioned tube and no evidence of acute complication. IMPRESSION: Successful placement of a 14 French suprapubic catheter. PLAN: Return to interventional Radiology in 6 weeks for catheter exchange and up size to a 16 [place] Foley catheter. Signed, Criselda Peaches, MD [facility] Radiology Electronically Signed   By: Jacqulynn Cadet M.D.   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  The patient gets his care at the [facility] and recommend that he follow-up there.  His suprapubic catheter should be changed in about 6 weeks.   Link Snuffer, MD Urology 02/17/2018, 9:14 PM

## 2018-02-17 NOTE — Progress Notes (Signed)
Pt received from IR. Suprapubic catheter dressing is clean, dry, intact. Pt denies pain. VSS. Suprapubic catheter draining appropriately, 500cc emptied and charted in output. Call bell within reach, bed alarm on, will continue to monitor.   Fritz Pickerel, RN

## 2018-02-17 NOTE — Progress Notes (Signed)
  Date: 02/17/2018  Patient name: Elijah Castro  Medical record number: [id number]  Date of birth: 1941-09-27   I have seen and evaluated this patient and I have discussed the plan of care with the house staff. Please see their note for complete details. I concur with their findings with the following additions/corrections:   77 yo M admitted with transient episode of AMS, now resolved and back to baseline. Work up negative including brain MRI. Possibly presyncope or hypoglycemia, seizure remains on the differential but less likely. Noted to have urinary retention yesterday, seen by urology, appreciate their recommendations of suprapubic catheter (unable to pass Foley) and follow up in their clinic. Renal US with no hydronephrosis, renal function remains normal. Suprapubic catheter placed today, ready to move toward discharge to SNF based on PT/OT recommendations, hopefully discharge tomorrow.   Lenice Pressman, M.D., Ph.D. 02/17/2018, 2:49 PM

## 2018-02-17 NOTE — Procedures (Signed)
Interventional Radiology Procedure Note  Procedure: Placement of a 24F suprapubic catheter.   Complications: None  Estimated Blood Loss: None  Recommendations: - Return to floor.  - Return to IR in 4-6 weeks for tube exchange and upsize to 39F foley  Signed,  Criselda Peaches, MD

## 2018-02-17 NOTE — Consult Note (Signed)
Chief Complaint: Patient was seen in consultation today for supra pubic catheter placement Chief Complaint  Patient presents with  . Loss of Consciousness   at the request of Dr Keene Breath  Supervising Physician: Jacqulynn Cadet  Patient Status: [facility] - In-pt  History of Present Illness: Elijah Castro is a 77 y.o. male   Pt with known BPH Urinary retention; urethral stricture Dr Jeffie Pollock unable to place foley-- difficult cannulation +bladder scan-- over 1L Palpable bladder-- at umbilicus per Dr Jeffie Pollock note Distension seen on US IMPRESSION: 1. No evidence of hydronephrosis. 2. Mildly increased renal parenchymal echogenicity raises concern for medical renal disease. 3. Mildly distended bladder, raising question for bladder outlet obstruction. Would correlate for associated symptoms.  Request made for supra pubic catheter placement per Dr Jeffie Pollock Pt LD Plavix just 2 days ago Spoke to Dr Jeffie Pollock--- would like IR to move ahead with placement-- Risk for bleeding is noted; but with distension at this level-- feels is necessary to move ahead today Dr Laurence Ferrari willing to do so Pt seems to understand additional bleeding risk  Past Medical History:  Diagnosis Date  . BPH (benign prostatic hyperplasia)   . CHF (congestive heart failure) ([place])   . Coronary artery disease   . Diabetes mellitus without complication ([place])   . Hyperlipidemia   . Hypertension     Past Surgical History:  Procedure Laterality Date  . JOINT REPLACEMENT      Allergies: Patient has no known allergies.  Medications: Prior to Admission medications   Medication Sig Start Date End Date Taking? Authorizing Provider  acetaminophen (TYLENOL) 500 MG tablet Take 1,000 mg by mouth as needed for mild pain.   Yes [provider]  aspirin 81 MG chewable tablet Chew 81 mg by mouth daily.   Yes [provider]  atorvastatin (LIPITOR) 80 MG tablet Take 40 mg by mouth at bedtime.   Yes [provider]  clopidogrel (PLAVIX) 75 MG tablet Take 75 mg by mouth daily. 10/12/17  Yes [provider]  finasteride (PROSCAR) 5 MG tablet Take 5 mg by mouth daily.   Yes [provider]  Insulin Aspart (NOVOLOG FLEXPEN [place]) Inject 5-10 Units into the skin 3 (three) times daily before meals.   Yes [provider]  lisinopril (PRINIVIL,ZESTRIL) 40 MG tablet Take 20 mg by mouth daily.   Yes [provider]  megestrol (MEGACE) 20 MG tablet Take 20 mg by mouth daily. 02/10/18 02/10/19 Yes [provider]  metoprolol tartrate (LOPRESSOR) 25 MG tablet Take 12.5 mg by mouth 2 (two) times daily.   Yes [provider]  ondansetron (ZOFRAN-ODT) 4 MG disintegrating tablet Take 4 mg by mouth every 6 (six) hours as needed for nausea/vomiting. 01/27/18  Yes [provider]  terazosin (HYTRIN) 2 MG capsule Take 2 mg by mouth at bedtime.   Yes [provider]  insulin glargine (LANTUS) 100 UNIT/ML injection Inject 0.2 mLs (20 Units total) into the skin at bedtime. Patient not taking: Reported on 02/15/2018 05/08/16   Vaughan Basta, MD     Family History  Problem Relation Age of Onset  . Diabetes Mellitus II Daughter     Social History   Socioeconomic History  . Marital status: Divorced    Spouse name: Not on file  . Number of children: Not on file  . Years of education: Not on file  . Highest education level: Not on file  Occupational History  . Not on file  Social Needs  .  Financial resource strain: Not on file  . Food insecurity:    Worry: Not on file    Inability: Not on file  . Transportation needs:    Medical: Not on file    Non-medical: Not on file  Tobacco Use  . Smoking status: Former Smoker  Substance and Sexual Activity  . Alcohol use: No  . Drug use: No  . Sexual activity: Not on file  Lifestyle  . Physical activity:    Days per week: Not on file    Minutes per session: Not on file  . Stress: Not on  file  Relationships  . Social connections:    Talks on phone: Not on file    Gets together: Not on file    Attends religious service: Not on file    Active member of club or organization: Not on file    Attends meetings of clubs or organizations: Not on file    Relationship status: Not on file  Other Topics Concern  . Not on file  Social History Narrative  . Not on file    Review of Systems: A 12 point ROS discussed and pertinent positives are indicated in the HPI above.  All other systems are negative.  Review of Systems  Constitutional: Negative for appetite change, fatigue and fever.  Respiratory: Negative for cough and shortness of breath.   Cardiovascular: Negative for chest pain.  Gastrointestinal: Positive for abdominal pain.  Genitourinary: Positive for difficulty urinating.  Neurological: Negative for weakness.  Psychiatric/Behavioral: Negative for behavioral problems and confusion.    Vital Signs: BP (!) 136/55 (BP Location: Left Arm)   Pulse 82   Temp (!) 97.5 F (36.4 C) (Oral)   Resp 17   Ht 5\' 8"  (1.727 m)   Wt 170 lb 13.7 oz (77.5 kg)   SpO2 100%   BMI 25.98 kg/m   Physical Exam  Constitutional: He is oriented to person, place, and time.  Cardiovascular: Normal rate and regular rhythm.  Pulmonary/Chest: Effort normal and breath sounds normal.  Abdominal: Soft. Bowel sounds are normal. There is no tenderness.  Musculoskeletal: Normal range of motion.  Neurological: He is alert and oriented to person, place, and time.  Skin: Skin is warm and dry.  Psychiatric: He has a normal mood and affect. His behavior is normal. Judgment and thought content normal.  Nursing note and vitals reviewed.   Imaging: Dg Skull 1-3 Views  Result Date: 02/15/2018 CLINICAL DATA:  MRI clearance EXAM: SKULL - 1-3 VIEW COMPARISON:  None FINDINGS: Metallic dental appliances. Additional small metallic foreign body at RIGHT mandible. No metallic foreign bodies project over the  orbits or skull. Visualized paranasal sinuses clear. No acute osseous findings. IMPRESSION: No metallic foreign bodies project over the orbits or skull. Electronically Signed   By: Lavonia Dana M.D.   On: 02/15/2018 18:37   Dg Chest 2 View  Result Date: 02/15/2018 CLINICAL DATA:  Syncope, history CHF, coronary artery disease, hypertension, diabetes mellitus, former smoker EXAM: CHEST - 2 VIEW COMPARISON:  None FINDINGS: Normal heart size, mediastinal contours, and pulmonary vascularity. Lungs minimally hyperaerated but clear. No pleural effusion or pneumothorax. Bones unremarkable. IMPRESSION: No acute abnormalities. Electronically Signed   By: Lavonia Dana M.D.   On: 02/15/2018 13:21   Mr Brain Wo Contrast  Result Date: 02/15/2018 CLINICAL DATA:  Syncope.  No cardiac symptoms. EXAM: MRI HEAD WITHOUT CONTRAST TECHNIQUE: Multiplanar, multiecho pulse sequences of the brain and surrounding structures were obtained without intravenous contrast. COMPARISON:  None. FINDINGS: Brain: No acute infarction, hemorrhage, hydrocephalus, extra-axial collection or mass lesion. Nodular diffusion hyperintense focus along a right parietal sulcus is attributed to susceptibility artifact from neighboring chronic blood products. Confluent FLAIR hyperintensity in the cerebral white matter attributed to chronic small vessel ischemia given patient's history. Small remote bilateral cerebellar infarcts. Small remote left posterior frontal cortex infarct with mild chronic blood products. Small remote left occipital cortex infarct. Generalized atrophy. Vascular: Major flow voids are preserved. Skull and upper cervical spine: Degenerative disc disease and retro dental ligamentous thickening. Negative for marrow signal abnormality. Sinuses/Orbits: Bilateral cataract resection. IMPRESSION: 1. No acute finding. 2. Advanced chronic small vessel ischemia. 3. 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 Electronically Signed   By: Monte Fantasia M.D.   On: 02/15/2018 19:44   US Renal  Result Date: 02/16/2018 CLINICAL DATA:  Chronic urinary retention. EXAM: RENAL / URINARY TRACT ULTRASOUND COMPLETE COMPARISON:  None. FINDINGS: Right Kidney: Length: 10.5 cm. Mildly increased parenchymal echogenicity is noted. No mass or hydronephrosis visualized. Left Kidney: Length: 11.5 cm. Mildly increased parenchymal echogenicity is noted. No mass or hydronephrosis visualized. Bladder: The bladder is markedly distended. IMPRESSION: 1. No evidence of hydronephrosis. 2. Mildly increased renal parenchymal echogenicity raises concern for medical renal disease. 3. Mildly distended bladder, raising question for bladder outlet obstruction. Would correlate for associated symptoms. Electronically Signed   By: Garald Balding M.D.   On: 02/16/2018 23:29    Labs:  CBC: Recent Labs    02/15/18 1241 02/16/18 0305  WBC 2.5* 3.2*  HGB 10.2* 10.0*  HCT 30.8* 30.8*  PLT 172 170    COAGS: Recent Labs    02/17/18 0733  INR 1.16    BMP: Recent Labs    02/15/18 1241 02/16/18 0305 02/17/18 0733  NA 137 140 137  K 3.3* 3.0* 3.7  CL 106 108 108  CO2 18* 22 20*  GLUCOSE 104* 164* 224*  BUN 20 13 7   CALCIUM 9.0 8.9 8.5*  CREATININE 0.99 0.86 0.73  GFRNONAA >60 >60 >60  GFRAA >60 >60 >60    LIVER FUNCTION TESTS: Recent Labs    02/15/18 1241  BILITOT 1.5*  AST 30  ALT 22  ALKPHOS 134*  PROT 7.2  ALBUMIN 3.5    TUMOR MARKERS: No results for input(s): AFPTM, CEA, CA199, CHROMGRNA in the last 8760 hours.  Assessment and Plan:  Urethral stricture; urinary retention Bladder distension Scheduled for supra pubic catheter placement in IR Risks and benefits discussed with the patient including bleeding, infection, damage to adjacent structures, and sepsis.  All of the patient's questions were answered, patient is agreeable to proceed. Consent signed and in  chart.   Thank you for this interesting consult.  I greatly enjoyed meeting Montrice Gracey and look forward to participating in their care.  A copy of this report was sent to the requesting provider on this date.  Electronically Signed: Lavonia Drafts, PA-C 02/17/2018, 10:35 AM   I spent a total of 40 Minutes    in face to face in clinical consultation, greater than 50% of which was counseling/coordinating care for supra pubic catheter placement

## 2018-02-17 NOTE — Sedation Documentation (Signed)
Patient denies pain and is resting comfortably.  

## 2018-02-17 NOTE — Clinical Social Work Note (Signed)
Clinical Social Work Assessment  Patient Details  Name: Jahmir Salo MRN: [id number] Date of Birth: 1941/03/08  Date of referral:  02/17/18               Reason for consult:  Facility Placement, Discharge Planning                Permission sought to share information with:  Family Supports Permission granted to share information::  Yes, Verbal Permission Granted  Name::     Dustine Stickler  Agency::  snf  Relationship::  daughter  Contact Information:  [phone]  Housing/Transportation Living arrangements for the past 2 months:  Single Family Home Source of Information:  Patient Patient Interpreter Needed:  None Criminal Activity/Legal Involvement Pertinent to Current Situation/Hospitalization:  No - Comment as needed Significant Relationships:  Adult Children Lives with:  Adult Children Do you feel safe going back to the place where you live?  Yes Need for family participation in patient care:  Yes (Comment)  Care giving concerns:  No family at bedside. Patient stated he lives with his daughter Teddy Spike in [place] for 2weeks and then goes to [place] to live with his other daughter Otila Kluver for 2 weeks. Patient stated he has 5 children and they are supportive.  Social Worker assessment / plan: CSW following patient for discharge needs. CSW spoke to patient about discharging to rehab. Patient stated I would have to call Debora to see if she wants him to do rehab.  CSW spoke with patients daughter via phone. Debora stated she is agreeable for patient to go to rehab but stated I would have to ask her sister Otila Kluver because patient has an appointment Monday to receive a port for his cancer treatment.  CSW has reached out to daughter Otila Kluver and is awaiting phone car back Employment status:  Retired Forensic scientist:  Medicare PT Recommendations:  [place] / Referral to community resources:  [place]  Patient/Family's Response to care: Patient  very talkative stating he appreciates the Hospital for his care. Patient stating that he hates his bed alarm and wish he could have more independence   Patient/Family's Understanding of and Emotional Response to Diagnosis, Current Treatment, and Prognosis:  Family supportive of patients needs. Family is aware of patients medical diagnoses and has been supportive  Emotional Assessment Appearance:  Appears younger than stated age Attitude/Demeanor/Rapport:  Engaged Affect (typically observed):  Accepting, Pleasant Orientation:  Oriented to Self, Oriented to Place, Oriented to Situation, Oriented to  Time Alcohol / Substance use:  Not Applicable Psych involvement (Current and /or in the community):  No (Comment)  Discharge Needs  Concerns to be addressed:  No discharge needs identified Readmission within the last 30 days:  No Current discharge risk:  Dependent with Mobility Barriers to Discharge:  No SNF bed   Wende Neighbors, LCSW 02/17/2018, 12:57 PM

## 2018-02-18 DIAGNOSIS — E872 Acidosis: Secondary | ICD-10-CM

## 2018-02-18 LAB — BASIC METABOLIC PANEL
Anion gap: 10 (ref 5–15)
BUN: 6 mg/dL (ref 6–20)
CO2: 19 mmol/L — ABNORMAL LOW (ref 22–32)
Calcium: 8.7 mg/dL — ABNORMAL LOW (ref 8.9–10.3)
Chloride: 109 mmol/L (ref 101–111)
Creatinine, Ser: 0.93 mg/dL (ref 0.61–1.24)
GFR calc Af Amer: >60 mL/min (ref >60)
GFR calc non Af Amer: >60 mL/min (ref >60)
Glucose, Bld: 162 mg/dL — ABNORMAL HIGH (ref 65–99)
Potassium: 3.3 mmol/L — ABNORMAL LOW (ref 3.5–5.1)
Sodium: 138 mmol/L (ref 135–145)

## 2018-02-18 LAB — GLUCOSE, CAPILLARY
Glucose-Capillary: 133 mg/dL — ABNORMAL HIGH (ref 65–99)
Glucose-Capillary: 133 mg/dL — ABNORMAL HIGH (ref 65–99)
Glucose-Capillary: 106 mg/dL — ABNORMAL HIGH (ref 65–99)
Glucose-Capillary: 136 mg/dL — ABNORMAL HIGH (ref 65–99)

## 2018-02-18 MED ORDER — BOOST / RESOURCE BREEZE PO LIQD CUSTOM
1.0000 | Freq: Three times a day (TID) | ORAL | Status: DC
  Administered 2018-02-19: 237 mL via ORAL
  Administered 2018-02-19: 1 via ORAL
  Filled 2018-02-18 (×5): qty 1

## 2018-02-18 MED ORDER — SODIUM CHLORIDE 0.9 % IV SOLN
INTRAVENOUS | Status: DC
  Administered 2018-02-18 – 2018-02-19 (×3): via INTRAVENOUS

## 2018-02-18 MED ORDER — POTASSIUM CHLORIDE CRYS ER 20 MEQ PO TBCR
40.0000 meq | EXTENDED_RELEASE_TABLET | Freq: Once | ORAL | Status: AC
  Administered 2018-02-18: 40 meq via ORAL
  Filled 2018-02-18: qty 2

## 2018-02-18 NOTE — Progress Notes (Signed)
Report called to [facility] on 45M. Pt's daughter made aware of transfer.  Fritz Pickerel, RN

## 2018-02-18 NOTE — Progress Notes (Signed)
Subjective: Elijah Castro was resting comfortably in bed during interview this morning. He received his suprapubic catheter yesterday and denies any pain at the catheter site or abdominal discomfort. When asked about his preference for going home with home health vs SNF, he expressed interest in SNF. He stated he preferred having the constant assistance that a SNF can provide rather than staying home alone while his daughter is at work. Per social work, he will be unable to receive chemotherapy while at [facility]. When it was explained to him that this may compromise his ability to go to his appointment for port placement on 4/22 and receive chemotherapy, he preferred that his daughter Elijah Castro make the decision for him. Elijah Castro was contacted and prefers he stay with her in [place] beginning Monday, 4/22 so that he may receive chemotherapy. She states he would be home alone from 3-6pm on weekdays. Her hope is that home health services could be present during that time. Once he completes his treatment, she states they will look into SNF options if that is still his preference.    Objective:  Vital signs in last 24 hours: Vitals:   02/17/18 2037 02/17/18 2247 02/18/18 0431 02/18/18 1204  BP: 124/62 (!) 106/53 (!) 109/55 (!) 96/50  Pulse: 82  76 65  Resp: 16  17   Temp: 97.9 F (36.6 C)  98.6 F (37 C)   TempSrc: Oral  Oral   SpO2: 96%  98%   Weight:      Height:       Physical Exam: General Appearance: Well developed, well nourished, in no acute distress resting in bed. Lungs: Normal WOB without any wheezing or crackles. Cardiovascular: Normal rate, regular rhythm, no m/r/g. Peripheral pulses 1+ and symmetric. Abdomen: soft and nontender with normal bowel sounds and no suprapubic fullness. Suprapubic catheter in place with no surrounding ecchymosis or swelling. GU: dark yellow urine in catheter bag Extremities: No cyanosis, clubbing or edema.  Labs: CMP: K 3.3, bicarb 19, glucose 162  Imaging: 4/14  MRI brain wo contrast: 1. No acute finding. 2. Advanced chronic small vessel ischemia. 3. Remote small vessel infarcts in the bilateral cerebellum, left occipital cortex, and left posterior frontal cortex.  4/15 renal US: 1. No evidence of hydronephrosis. 2. Mildly increased renal parenchymal echogenicity raises concern for medical renal disease. 3. Mildly distended bladder, raising question for bladder outlet obstruction. Would correlate for associated symptoms.  Assessment/Plan:  Principal Problem:   Altered mental status Active Problems:   Hyperglycemia   Urinary retention  Elijah Castro is a 77 y.o. M with PMH recently diagnosed pancreatic adenocarcinoma, CVA, CAD, DM2, HLD, HTN and BPH presenting with transient altered mental status manifested by staring and unresponsiveness with no acute process visualized on MRI. He is currently at his baseline level of functioning. He received a suprapubic catheter after he was found to be retaining large volume of urine.   Transient AMS: Episodic symptoms with poor oral intake and positive orthostatic vitals suggest syncope in the setting of dehydration vs hypoglycemia. Stroke and CNS mets ruled out with MRI brain. Seizure may also be considered; however, EEG would be low yield given pt has been at baseline since admission. No chest pain or consistent arrhythmia to suggest cardiac etiology.  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  Renal US with no hydronephrosis.  - discontinue terazosin and finasteride - outpt suprapubic catheter replacement in 6 wks per urology - outpt evaluation of urethral stricture  Normal anion gap metabolic acidosis with hypokalemia: Bicarb 19, K 3.3. Likely diuresis secondary to  relief of urinary obstruction in conjunction with poor fluid intake. - replete K - IV fluid resuscitation  History of CVA: MRI confirms small vessel of ischemia and remote infarcts. No focal neurological deficits deviating from baseline at this time. - continue ASA 81 mg po qd, atorvastatin 80 mg po qd  Pancreatic adenocarcinoma: Candidate for resection and [facility]oad[facility]. Takes megestrol for appetite stimulation, but has limited efficacy and may be contributing to AMS. - hold megestrol - appt for port [facility] in [facility] 4/22  DM2: A1c 8.5. Glucose well controlled over last 24h. Patient has poor oral intake at baseline, so we would like to be sparing with insulin.  - metformin 500 mg po bid  Mild neutropenia: WBC 3.2, ANC 1.1. Likely acute given WBC 6.4 in 10/2017, making stable ethnic neutropenia less likely. Favor malnutrition given poor po intake. Current meds not consistent with agranulocytosis. No infectious symptoms. - review smear  FEN/GI: F: NS 75 mL/hr E: replete K N: carb modified GI: none needed at this time  DVT PPX: Lovenox held in setting of SP tube insertion  Dispo: Patient is medically ready for discharge. Beginning Monday, he will be supervised for most of the day by his daughter and son-in-law in [place]. The remainder of this week, however, he would be remaining with his daughter in [place] with no supervision during the day other than what home health services can provide. We are in the process of confirming his oncological treatment schedule with the [place].  Sarina Ser, Medical Student 02/18/2018, 12:40 PM Pager: [phone]  Attestation for Student Documentation:  I personally was present and performed or re-performed the history, physical exam and medical decision-making activities of this service and have verified that the service and findings are accurately documented in the student's note.  Ledell Noss,  MD 02/18/2018, 5:15 PM

## 2018-02-18 NOTE — Progress Notes (Signed)
Clinical Social Worker following patient and family for support and discharge need. CSW spoke to daughter Otila Kluver via phone. CSW explained short term rehab and stated that if patient discharge to rehab in the [place] area that family would have to reschedule his appointment with the [place] in [place]. Otila Kluver stated that patient has an appointment Monday to receive his port through the [place]. Otila Kluver stated she wants patient to start on his chemo as soon as possible. CSW explained to Otila Kluver that if patient goes to rehab that he would not be able to receive chemo at the same time. Otila Kluver stated that she would rather patient discharge home to the care of her sister Teddy Spike and have sister drop patient off in [facility] Sunday night to stay with her. CSW will inform patients MD and RNCM of family's decision to take patient home.   Rhea Pink, MSW,  [place]

## 2018-02-18 NOTE — Progress Notes (Signed)
Pt transferred to 5M19. Pt transferred with phone, phone charger, depends, and home clothes.   Fritz Pickerel, RN

## 2018-02-18 NOTE — Care Management Note (Addendum)
Case Management Note Marvetta Gibbons RN, BSN Unit 4E-Case Manager [phone]  Patient Details  Name: Elijah Castro MRN: [id number] Date of Birth: 09/21/1941  Subjective/Objective:  Pt admitted with AMS/presyncope, urinary retention s/p suprapubic cath placement                 Action/Plan: PTA pt was living between his two daughters one here and one in [place]. Hx of recent dx of pancreatic CA- [facility] [place] [facility]. Per PT eval recommendation for STSNF- however his daughters would prefer to take pt home and be able to take pt for is f/u with his [place] doctor next week to move forward with his treatment for his cancer. Plan will be for pt to d/c home with his daughter Elijah Castro here in [facility] contact # [phone] and then go to [place] next week on Monday to stay with his daughter Elijah Castro contact # [phone] Have spoken with both Otila Kluver and Debora via TC regarding the transition plan and both are agreeable and have confirmed plan for d/c home instead of STSNF. Choice was offered for [facility] agency that could service pt both here and [place]- per Otila Kluver they would like to use [facility], they also request 3n1 for home. Orders have been placed- referral called to [facility] with Alvis Lemmings - who has accepted referral.  Notified James with [facility] for DME need- 3n1 to be delivered to room prior to discharge.    Expected Discharge Date:    02/18/18              Expected Discharge Plan:  [place]  In-House Referral:  Clinical Social Work  Discharge planning Services  CM Consult  Post Acute Care Choice:  Home Health, Durable Medical Equipment Choice offered to:  Adult Children  DME Arranged:  3-N-1 DME Agency:  [place] Arranged:  PT, OT, Social Work RN/aide [place]:  [place]  Status of Service:  Completed, signed off  If discussed at [employer] of Stay Meetings, dates discussed:    Discharge Disposition:   Home/home health   Additional Comments:  Dawayne Patricia, RN 02/18/2018, 1:19 PM

## 2018-02-18 NOTE — Progress Notes (Signed)
  Date: 02/18/2018  Patient name: Elijah Castro  Medical record number: [id number]  Date of birth: February 03, 1941   I have seen and evaluated this patient and I have discussed the plan of care with the house staff. Please see their note for complete details.  Lenice Pressman, M.D., Ph.D. 02/18/2018

## 2018-02-18 NOTE — Progress Notes (Signed)
Inpatient Diabetes Program Recommendations  AACE/ADA: New Consensus Statement on Inpatient Glycemic Control (2015)  Target Ranges:  Prepandial:   less than 140 mg/dL      Peak postprandial:   less than 180 mg/dL (1-2 hours)      Critically ill patients:  140 - 180 mg/dL  Results for Elijah Castro, Elijah Castro (MRN [id number]) as of 02/18/2018 11:02  Ref. Range 02/17/2018 06:11 02/17/2018 11:31 02/17/2018 16:07 02/17/2018 21:32 02/18/2018 06:11  Glucose-Capillary Latest Ref Range: 65 - 99 mg/dL 202 (H) 182 (H) 212 (H) 135 (H) 133 (H)    Review of Glycemic Control  Diabetes history: DM2 Outpatient Diabetes medications: Novolog 5-10 units TID with meals Current orders for Inpatient glycemic control: Novolog 3 units TID with meals, Metformin 500 mg BID  Inpatient Diabetes Program Recommendations: Correction (SSI): While inpatient, please consider ordering Novolog 0-9 units TID with meals and Novolog 0-5 units QHS (in addition to Novolog 3 units TID with meals for meal coverage).  Thanks, Barnie Alderman, RN, MSN, CDE Diabetes Coordinator Inpatient Diabetes Program [phone] (Team Pager from 8am to 5pm)

## 2018-02-18 NOTE — Progress Notes (Signed)
Referring Physician(s): Dr. Jeffie Pollock  Supervising Physician: Jacqulynn Cadet  Patient Status:  [facility] - In-pt  Chief Complaint: Urinary retention, s/p SP catheter placement 02/17/18  Subjective: Awake.  Denies complaints.   Allergies: Patient has no known allergies.  Medications: Prior to Admission medications   Medication Sig Start Date End Date Taking? Authorizing Provider  acetaminophen (TYLENOL) 500 MG tablet Take 1,000 mg by mouth as needed for mild pain.   Yes [provider]  aspirin 81 MG chewable tablet Chew 81 mg by mouth daily.   Yes [provider]  atorvastatin (LIPITOR) 80 MG tablet Take 40 mg by mouth at bedtime.   Yes [provider]  clopidogrel (PLAVIX) 75 MG tablet Take 75 mg by mouth daily. 10/12/17  Yes [provider]  finasteride (PROSCAR) 5 MG tablet Take 5 mg by mouth daily.   Yes [provider]  Insulin Aspart (NOVOLOG FLEXPEN ) Inject 5-10 Units into the skin 3 (three) times daily before meals.   Yes [provider]  lisinopril (PRINIVIL,ZESTRIL) 40 MG tablet Take 20 mg by mouth daily.   Yes [provider]  megestrol (MEGACE) 20 MG tablet Take 20 mg by mouth daily. 02/10/18 02/10/19 Yes [provider]  metoprolol tartrate (LOPRESSOR) 25 MG tablet Take 12.5 mg by mouth 2 (two) times daily.   Yes [provider]  ondansetron (ZOFRAN-ODT) 4 MG disintegrating tablet Take 4 mg by mouth every 6 (six) hours as needed for nausea/vomiting. 01/27/18  Yes [provider]  terazosin (HYTRIN) 2 MG capsule Take 2 mg by mouth at bedtime.   Yes [provider]  insulin glargine (LANTUS) 100 UNIT/ML injection Inject 0.2 mLs (20 Units total) into the skin at bedtime. Patient not taking: Reported on 02/15/2018 05/08/16   Vaughan Basta, MD     Vital Signs: BP (!) 109/55 (BP Location: Left Arm)   Pulse 76   Temp 98.6 F (37 C) (Oral)   Resp 17   Ht 5\' 8"  (1.727 m)    Wt 170 lb 13.7 oz (77.5 kg)   SpO2 98%   BMI 25.98 kg/m   Physical Exam  Nursing note and vitals reviewed. Awake, NAD, alert Pelvis:  Drain in place.  Old, dried blood at insertion site.  No fresh bleeding or oozing.  No erythema or irritation.  Non-tender. Clear, yellow urine in collection bag.   Imaging: Dg Skull 1-3 Views  Result Date: 02/15/2018 CLINICAL DATA:  MRI clearance EXAM: SKULL - 1-3 VIEW COMPARISON:  None FINDINGS: Metallic dental appliances. Additional small metallic foreign body at RIGHT mandible. No metallic foreign bodies project over the orbits or skull. Visualized paranasal sinuses clear. No acute osseous findings. IMPRESSION: No metallic foreign bodies project over the orbits or skull. Electronically Signed   By: Lavonia Dana M.D.   On: 02/15/2018 18:37   Dg Chest 2 View  Result Date: 02/15/2018 CLINICAL DATA:  Syncope, history CHF, coronary artery disease, hypertension, diabetes mellitus, former smoker EXAM: CHEST - 2 VIEW COMPARISON:  None FINDINGS: Normal heart size, mediastinal contours, and pulmonary vascularity. Lungs minimally hyperaerated but clear. No pleural effusion or pneumothorax. Bones unremarkable. IMPRESSION: No acute abnormalities. Electronically Signed   By: Lavonia Dana M.D.   On: 02/15/2018 13:21   Mr Brain Wo Contrast  Result Date: 02/15/2018 CLINICAL DATA:  Syncope.  No cardiac symptoms. EXAM: MRI HEAD WITHOUT CONTRAST TECHNIQUE: Multiplanar, multiecho pulse sequences of the brain and surrounding structures were obtained without intravenous contrast. COMPARISON:  None. FINDINGS: Brain: No acute infarction, hemorrhage, hydrocephalus, extra-axial collection or mass lesion. Nodular diffusion hyperintense focus along a right parietal sulcus is attributed to susceptibility artifact from neighboring chronic blood products. Confluent FLAIR hyperintensity in the cerebral white matter attributed to chronic small vessel ischemia given patient's history. Small  remote bilateral cerebellar infarcts. Small remote left posterior frontal cortex infarct with mild chronic blood products. Small remote left occipital cortex infarct. Generalized atrophy. Vascular: Major flow voids are preserved. Skull and upper cervical spine: Degenerative disc disease and retro dental ligamentous thickening. Negative for marrow signal abnormality. Sinuses/Orbits: Bilateral cataract resection. IMPRESSION: 1. No acute finding. 2. Advanced chronic small vessel ischemia. 3. Remote small vessel infarcts in the bilateral cerebellum, left occipital cortex, and left posterior frontal cortex. Electronically Signed   By: Monte Fantasia M.D.   On: 02/15/2018 19:44   US Renal  Result Date: 02/16/2018 CLINICAL DATA:  Chronic urinary retention. EXAM: RENAL / URINARY TRACT ULTRASOUND COMPLETE COMPARISON:  None. FINDINGS: Right Kidney: Length: 10.5 cm. Mildly increased parenchymal echogenicity is noted. No mass or hydronephrosis visualized. Left Kidney: Length: 11.5 cm. Mildly increased parenchymal echogenicity is noted. No mass or hydronephrosis visualized. Bladder: The bladder is markedly distended. IMPRESSION: 1. No evidence of hydronephrosis. 2. Mildly increased renal parenchymal echogenicity raises concern for medical renal disease. 3. Mildly distended bladder, raising question for bladder outlet obstruction. Would correlate for associated symptoms. Electronically Signed   By: Garald Balding M.D.   On: 02/16/2018 23:29   Ct Image Guided Drainage By Percutaneous Catheter  Result Date: 02/17/2018 INDICATION: 77 year old male with bladder outlet obstruction secondary to a urethral stricture. Recent unsuccessful attempt at Foley catheter placement. Patient presents at the request of urology for suprapubic catheter placement. EXAM: CT-guided placement of a suprapubic catheter COMPARISON:  Renal ultrasound 02/16/2018 MEDICATIONS: 2 g Ancef; The antibiotic was administered in an appropriate time frame  prior to skin puncture. ANESTHESIA/SEDATION: Fentanyl 50 mcg IV; Versed 1 mg IV Moderate Sedation Time:  21 minutes The patient was continuously monitored during the procedure by the interventional radiology nurse under my direct supervision. CONTRAST:  None FLUOROSCOPY TIME:  Fluoroscopy Time: 0 minutes 0 seconds (0 mGy). COMPLICATIONS: None immediate. PROCEDURE: Informed written consent was obtained from the patient after a thorough discussion of the procedural risks, benefits and alternatives. All questions were addressed. Maximal Sterile Barrier Technique was utilized including caps, mask, sterile gowns, sterile gloves, sterile drape, hand hygiene and skin antiseptic. A timeout was performed prior to the initiation of the procedure. A planning axial CT scan was performed. The distended bladder is successfully identified. A suitable spot in the midline low anterior abdominal wall was selected and the overlying skin marked. Following sterile prep and drape in the standard fashion with chlorhexidine skin prep, local anesthesia was attained by infiltration with 1% lidocaine. A small dermatotomy was made. Using intermittent CT guidance, an 18 gauge trocar needle was advanced through the median raphe between the rectus abdominus muscles and into the bladder. Removal of the stylet demonstrates release of clear yellow urine. A 0.035 wire was advanced to the needle and coiled in the bladder. Skin tract was then dilated to 14 [place] and a flexible 14 [place] all-purpose drainage catheter was advanced over the wire and formed in the bladder. The catheter was connected to gravity bag drainage and sutured to the skin with 0 Prolene suture. A follow-up axial CT scan demonstrates a well-positioned tube and no evidence of acute complication. IMPRESSION: Successful placement of a 14  French suprapubic catheter. PLAN: Return to interventional Radiology in 6 weeks for catheter exchange and up size to a 16 [place] Foley catheter.  Signed, Criselda Peaches, MD [facility] Radiology Electronically Signed   By: Jacqulynn Cadet M.D.   On: 02/17/2018 15:01    Labs:  CBC: Recent Labs    02/15/18 1241 02/16/18 0305  WBC 2.5* 3.2*  HGB 10.2* 10.0*  HCT 30.8* 30.8*  PLT 172 170    COAGS: Recent Labs    02/17/18 0733  INR 1.16    BMP: Recent Labs    02/15/18 1241 02/16/18 0305 02/17/18 0733  NA 137 140 137  K 3.3* 3.0* 3.7  CL 106 108 108  CO2 18* 22 20*  GLUCOSE 104* 164* 224*  BUN 20 13 7   CALCIUM 9.0 8.9 8.5*  CREATININE 0.99 0.86 0.73  GFRNONAA >60 >60 >60  GFRAA >60 >60 >60    LIVER FUNCTION TESTS: Recent Labs    02/15/18 1241  BILITOT 1.5*  AST 30  ALT 22  ALKPHOS 134*  PROT 7.2  ALBUMIN 3.5    Assessment and Plan: Urinary retention, urethral stricture, s/p SP catheter placement yesterday with Dr. Laurence Ferrari Suprapuic catheter in place this AM.  Dried blood at insertion site but no active bleeding or irritation.  Clear, yellow urine in collection bag.  IR available if needed.   Electronically Signed: Docia Barrier, PA 02/18/2018, 9:06 AM   I spent a total of 15 Minutes at the the patient's bedside AND on the patient's hospital floor or unit, greater than 50% of which was counseling/coordinating care for urinary retention.

## 2018-02-18 NOTE — Progress Notes (Signed)
Physical Therapy Treatment Patient Details Name: Elijah Castro MRN: [id number] DOB: [birth year] Today's Date: 02/18/2018    History of Present Illness Pt. is a 77 y.o. M with significant PMH of recently diagnosed pancreatic adenocarcinoma, CVA, CAD, DM2, HLD, HTN, and BPH presenting following an episode of staring and unresponsiveness this morning.     PT Comments    Pt is demonstrating a willingness to move but is impulsive and has low awareness of safety and control of walker. Would need to be in 24/7 supervised environment and PT recommends SNF to ensure safety and restoration of mobility is achieved.  He is motivated to move and will expect good progression of balance and gait skills in a rehab setting.  Follow acutely for strength, balance and control of gait on RW device.   Follow Up Recommendations  SNF     Equipment Recommendations  None recommended by PT    Recommendations for Other Services       Precautions / Restrictions Precautions Precautions: Fall Restrictions Weight Bearing Restrictions: No    Mobility  Bed Mobility Overal bed mobility: Needs Assistance Bed Mobility: Supine to Sit     Supine to sit: Supervision;Min guard     General bed mobility comments: pt is not aware of his lines at all  Transfers Overall transfer level: Needs assistance Equipment used: Rolling walker (2 wheeled) Transfers: Sit to/from Stand Sit to Stand: Min guard         General transfer comment: pt is using walker before bed surface to attempt standing, but did previously use RW  Ambulation/Gait Ambulation/Gait assistance: Min guard;Min assist Ambulation Distance (Feet): 80 Feet Assistive device: Rolling walker (2 wheeled) Gait Pattern/deviations: Narrow base of support;Trunk flexed;Shuffle;Step-through pattern Gait velocity: reduced Gait velocity interpretation: <1.31 ft/sec, indicative of household ambulator General Gait Details: pt is very unaware of clearance space  and logistics to maneuver walker, tends to get close to obstacles and cannot plan the turning task to sit, starts turn too soon   Stairs             Wheelchair Mobility    Modified Rankin (Stroke Patients Only)       Balance Overall balance assessment: Needs assistance Sitting-balance support: Feet supported Sitting balance-Leahy Scale: Good     Standing balance support: Bilateral upper extremity supported Standing balance-Leahy Scale: Fair Standing balance comment: requires walker for all dynamic standing and gait                            Cognition Arousal/Alertness: Awake/alert Behavior During Therapy: WFL for tasks assessed/performed Overall Cognitive Status: History of cognitive impairments - at baseline                                 General Comments: continues to demonstrate cognitive impairment and per nsg wants to go home alone.  PT would not support this      Exercises      General Comments General comments (skin integrity, edema, etc.): Pt is very unaware of safety and mechanics of walker, is high fall risk.  Have reiterated to nursing that PT would not support a transition home and prefer inpt to SNF      Pertinent Vitals/Pain Pain Assessment: No/denies pain    Home Living  Prior Function            PT Goals (current goals can now be found in the care plan section) Acute Rehab PT Goals Patient Stated Goal: to walk Progress towards PT goals: Progressing toward goals(pt requires contact to maintain safety with gait)    Frequency    Min 2X/week      PT Plan Current plan remains appropriate    Co-evaluation              AM-PAC PT "6 Clicks" Daily Activity  Outcome Measure  Difficulty turning over in bed (including adjusting bedclothes, sheets and blankets)?: None Difficulty moving from lying on back to sitting on the side of the bed? : A Little Difficulty sitting down on  and standing up from a chair with arms (e.g., wheelchair, bedside commode, etc,.)?: Unable Help needed moving to and from a bed to chair (including a wheelchair)?: A Little Help needed walking in hospital room?: A Little Help needed climbing 3-5 steps with a railing? : A Little 6 Click Score: 17    End of Session Equipment Utilized During Treatment: Gait belt Activity Tolerance: Patient tolerated treatment well Patient left: in chair;with call bell/phone within reach;with chair alarm set Nurse Communication: Mobility status PT Visit Diagnosis: Unsteadiness on feet (R26.81);History of falling (Z91.81);Muscle weakness (generalized) (M62.81);Difficulty in walking, not elsewhere classified (R26.2)     Time: 1638-4536 PT Time Calculation (min) (ACUTE ONLY): 24 min  Charges:  $Gait Training: 8-22 mins $Therapeutic Activity: 8-22 mins                    G Codes:  Functional Assessment Tool Used: AM-PAC 6 Clicks Basic Mobility     Ramond Dial 02/18/2018, 10:43 AM   Mee Hives, PT MS Acute Rehab Dept. Number: [place] [facility] [place]

## 2018-02-18 NOTE — Progress Notes (Signed)
Occupational Therapy Treatment Patient Details Name: Elijah Castro MRN: [id number] DOB: Nov 25, 1940 Today's Date: 02/18/2018    History of present illness Pt. is a 77 y.o. M with significant PMH of recently diagnosed pancreatic adenocarcinoma, CVA, CAD, DM2, HLD, HTN, and BPH presenting following an episode of staring and unresponsiveness this morning.    OT comments  Pt presents sitting up in recliner, agreeable to OT treatment session. Pt completing room and hallway level functional mobility using RW with MinA-MinGuard throughout this session. Pt continues to demonstrate some confusion during session and decreased safety awareness, requiring verbal cues for safety and safe RW use. Per chart review pt's daughter is wishing for pt to return to her home (vs SNF); d/c recommendations have been updated to reflect. Pt will benefit from continued [place] services and recommend pt have 24hr supervision at home to maximize pt's safety and independence with ADLs and mobility after discharge. Will continue to follow acutely to progress pt towards established OT goals.   Follow Up Recommendations  Home health OT;Supervision/Assistance - 24 hour    Equipment Recommendations  3 in 1 bedside commode          Precautions / Restrictions Precautions Precautions: Fall Restrictions Weight Bearing Restrictions: No       Mobility Bed Mobility Overal bed mobility: Needs Assistance Bed Mobility: Sit to Supine       Sit to supine: Min guard   General bed mobility comments: Minguard for safety and line management   Transfers Overall transfer level: Needs assistance Equipment used: Rolling walker (2 wheeled) Transfers: Sit to/from Stand Sit to Stand: Min guard         General transfer comment: minguard for safety during sit<>stand    Balance Overall balance assessment: Needs assistance Sitting-balance support: Feet supported Sitting balance-Leahy Scale: Good     Standing balance support:  Bilateral upper extremity supported;Single extremity supported Standing balance-Leahy Scale: Fair Standing balance comment: reliant on UE support for balance                           ADL either performed or assessed with clinical judgement   ADL Overall ADL's : Needs assistance/impaired                     Lower Body Dressing: Minimal assistance;Sit to/from stand               Functional mobility during ADLs: Minimal assistance;Min guard;Rolling walker General ADL Comments: pt continues to demonstrate some confusion and decreased safety; declining grooming ADLs but requesting to practice functional mobility, completing using RW in room and hallway with MinA-MinGuard, intermittent verbal safety cues with RW                        Cognition Arousal/Alertness: Awake/alert Behavior During Therapy: WFL for tasks assessed/performed Overall Cognitive Status: History of cognitive impairments - at baseline                                 General Comments: continues to demonstrate cognitive impairments and decreased safety awareness                          Pertinent Vitals/ Pain       Pain Assessment: No/denies pain  Frequency  Min 2X/week        Progress Toward Goals  OT Goals(current goals can now be found in the care plan section)  Progress towards OT goals: Progressing toward goals  Acute Rehab OT Goals Patient Stated Goal: to walk OT Goal Formulation: With patient Time For Goal Achievement: 03/02/18 Potential to Achieve Goals: Good  Plan Discharge plan needs to be updated                     AM-PAC PT "6 Clicks" Daily Activity     Outcome Measure   Help from another person eating meals?: None Help from another person taking care of personal grooming?: None Help from another person toileting, which includes using toliet,  bedpan, or urinal?: A Little Help from another person bathing (including washing, rinsing, drying)?: A Little Help from another person to put on and taking off regular upper body clothing?: A Little Help from another person to put on and taking off regular lower body clothing?: A Little 6 Click Score: 20    End of Session Equipment Utilized During Treatment: Gait belt;Rolling walker  OT Visit Diagnosis: Unsteadiness on feet (R26.81);Repeated falls (R29.6);History of falling (Z91.81)   Activity Tolerance Patient tolerated treatment well   Patient Left in bed;with call bell/phone within reach;with bed alarm set   Nurse Communication Mobility status        Time: 9201-0071 OT Time Calculation (min): 20 min  Charges: OT General Charges $OT Visit: 1 Visit OT Treatments $Therapeutic Activity: 8-22 mins  Elijah Castro, [place] Pager [phone] 02/18/2018    Elijah Castro 02/18/2018, 4:30 PM

## 2018-02-19 DIAGNOSIS — Z7984 Long term (current) use of oral hypoglycemic drugs: Secondary | ICD-10-CM

## 2018-02-19 LAB — GLUCOSE, CAPILLARY
GLUCOSE-CAPILLARY: 154 mg/dL — AB (ref 65–99)
Glucose-Capillary: 160 mg/dL — ABNORMAL HIGH (ref 65–99)
Glucose-Capillary: 263 mg/dL — ABNORMAL HIGH (ref 65–99)

## 2018-02-19 LAB — BASIC METABOLIC PANEL
Anion gap: 11 (ref 5–15)
BUN: <5 mg/dL — ABNORMAL LOW (ref 6–20)
CO2: 18 mmol/L — ABNORMAL LOW (ref 22–32)
Calcium: 8.6 mg/dL — ABNORMAL LOW (ref 8.9–10.3)
Chloride: 109 mmol/L (ref 101–111)
Creatinine, Ser: 0.81 mg/dL (ref 0.61–1.24)
GFR calc Af Amer: >60 mL/min (ref >60)
GFR calc non Af Amer: >60 mL/min (ref >60)
Glucose, Bld: 148 mg/dL — ABNORMAL HIGH (ref 65–99)
Potassium: 3.3 mmol/L — ABNORMAL LOW (ref 3.5–5.1)
Sodium: 138 mmol/L (ref 135–145)

## 2018-02-19 MED ORDER — POTASSIUM CHLORIDE CRYS ER 20 MEQ PO TBCR
40.0000 meq | EXTENDED_RELEASE_TABLET | Freq: Once | ORAL | Status: AC
  Administered 2018-02-19: 40 meq via ORAL
  Filled 2018-02-19: qty 2

## 2018-02-19 MED ORDER — METFORMIN HCL 500 MG PO TABS: 500.0000 mg | ORAL_TABLET | Freq: Two times a day (BID) | ORAL | 1 refills | Status: DC

## 2018-02-19 MED ORDER — LISINOPRIL 5 MG PO TABS: 5.0000 mg | ORAL_TABLET | Freq: Every day | ORAL | 11 refills | Status: DC

## 2018-02-19 NOTE — Care Management Important Message (Signed)
Important Message  Patient Details  Name: Roi Jafari MRN: [id number] Date of Birth: [birth year]   Medicare Important Message Given:  Yes    Wisam Siefring [address] 02/19/2018, 3:11 PM

## 2018-02-19 NOTE — Progress Notes (Signed)
Physical Therapy Treatment Patient Details Name: Elijah Castro MRN: [id number] DOB: November 01, 1941 Today's Date: 02/19/2018    History of Present Illness Pt. is a 77 y.o. M with significant PMH of recently diagnosed pancreatic adenocarcinoma, CVA, CAD, DM2, HLD, HTN, and BPH presenting following an episode of staring and unresponsiveness this morning.     PT Comments    Pt progressing well, plans to go to his daughter's home; recommend HHPT at d/c and incr supervision; improved safety with RW today while maneuvering in room;    Follow Up Recommendations  Home health PT;Supervision for mobility/OOB     Equipment Recommendations  None recommended by PT(has rollator--per pt report)    Recommendations for Other Services       Precautions / Restrictions Precautions Precautions: Fall Precaution Comments: pt has fallen at daughters home x1 in bedroom and pt reports he has difficulty getting on and off toilet at times. Restrictions Weight Bearing Restrictions: No    Mobility  Bed Mobility Overal bed mobility: Modified Independent                Transfers Overall transfer level: Needs assistance Equipment used: Rolling walker (2 wheeled) Transfers: Sit to/from Stand Sit to Stand: Min guard;Supervision         General transfer comment: cues for hand placement and control of descent  Ambulation/Gait Ambulation/Gait assistance: Min guard;Min assist Ambulation Distance (Feet): 20 Feet(in room) Assistive device: Rolling walker (2 wheeled) Gait Pattern/deviations: Step-through pattern;Decreased stride length     General Gait Details: intermittent cues for RW distance from self, min/guard to maneuver in tighter space of room/around obstacles   Stairs             Wheelchair Mobility    Modified Rankin (Stroke Patients Only)       Balance     Sitting balance-Leahy Scale: Good       Standing balance-Leahy Scale: Fair Standing balance comment: reliant on  UEs for dynamic balance                            Cognition Arousal/Alertness: Awake/alert Behavior During Therapy: WFL for tasks assessed/performed Overall Cognitive Status: History of cognitive impairments - at baseline                                 General Comments: pt appropriate duirng PT session today, improved safety awareness with amb/use of walker      Exercises      General Comments        Pertinent Vitals/Pain      Home Living                      Prior Function            PT Goals (current goals can now be found in the care plan section) Acute Rehab PT Goals Patient Stated Goal: to walk PT Goal Formulation: With patient Time For Goal Achievement: 03/02/18 Potential to Achieve Goals: Good Progress towards PT goals: Progressing toward goals    Frequency    Min 2X/week(if does not d/c incr to 3x/wk)      PT Plan Discharge plan needs to be updated    Co-evaluation              AM-PAC PT "6 Clicks" Daily Activity  Outcome Measure  Difficulty turning over in bed (including adjusting  bedclothes, sheets and blankets)?: None Difficulty moving from lying on back to sitting on the side of the bed? : None Difficulty sitting down on and standing up from a chair with arms (e.g., wheelchair, bedside commode, etc,.)?: A Little Help needed moving to and from a bed to chair (including a wheelchair)?: A Little Help needed walking in hospital room?: A Little Help needed climbing 3-5 steps with a railing? : A Little 6 Click Score: 20    End of Session Equipment Utilized During Treatment: Gait belt Activity Tolerance: Patient tolerated treatment well Patient left: Other (comment);with call bell/phone within reach(in bathroom, NT notified) Nurse Communication: Mobility status PT Visit Diagnosis: Unsteadiness on feet (R26.81);History of falling (Z91.81);Muscle weakness (generalized) (M62.81);Difficulty in walking, not  elsewhere classified (R26.2)     Time: 1638-4536 PT Time Calculation (min) (ACUTE ONLY): 16 min  Charges:  $Gait Training: 8-22 mins                    G CodesKenyon Ana, PT Pager: [phone] 02/19/2018    Kenyon Ana 02/19/2018, 3:27 PM

## 2018-02-19 NOTE — Progress Notes (Signed)
[place] with [facility] requested delivery of 3-in-1 to room at bedside prior to discharge.

## 2018-02-19 NOTE — Discharge Instructions (Signed)
Mr. Elijah Castro, Elijah Castro were hospitalized for an episode of unresponsiveness while you were at church. We believe your symptoms were due to dehydration. While you were in the hospital, we gave you fluids through your vein. We recommend you try your best to drink plenty of water and eat foods that are rich in proteins and electrolytes, like Boost.  We also found that you may have a urethral stricture, which blocks your ability to urinate. You were treated with a suprapubic catheter, which is a tube that goes into your bladder and drains your urine. You will need to follow up with a urologist at the [facility] to change your suprapubic catheter and see what needs to be done about your stricture.   Please follow the following instructions: - Take metformin for your diabetes. You should follow up with your primary care doctor about using insulin. We do not recommend insulin at this time. - Stop taking megestrol, terazosin and finasteride, as these may cause you to be dizzy or confused - Follow up with your oncologist and surgeon for evaluation of your pancreatic cancer - Follow up with Dr. Candiss Norse on Tuesday, April 23 at 11:30 AM. He will need to refer you to a urologist at the [facility] for change of your suprapubic catheter and evaluation of your urethral stricture. We will fax your records to his office.   Please call our clinic if you have any questions or concerns, we may be able to help and keep you from a long and expensive emergency room wait. Our clinic and after hours phone number is [phone], there is always someone available.

## 2018-02-19 NOTE — Progress Notes (Signed)
Subjective: Elijah Castro was sleeping comfortably when awoken for interview. He denied any abdominal pain, shortness of breath, or chest pain. He continues to have poor appetite. He stated he would drink vanilla Boost if it were provided. He was amenable to discharge home or to SNF today.   Regarding discharge, he reportedly has an appointment for port placement in [facility] 4/22. It is unclear when he would start chemotherapy as his first consultation with [facility] oncology is 4/30. If he were to be discharged home this evening, he will stay with his daughter Elijah Castro in [place], who can supervise him Friday and this weekend before taking him to [place] to stay with his other daughter, Elijah Castro.   Objective:  Vital signs in last 24 hours: Vitals:   02/18/18 1204 02/18/18 1812 02/18/18 2015 02/19/18 0558  BP: (!) 96/50 139/62 118/67 125/62  Pulse: 65  96 91  Resp:      Temp:   97.9 F (36.6 C) 98.2 F (36.8 C)  TempSrc:   Oral Oral  SpO2:   100% 100%  Weight:   77.3 kg (170 lb 6.7 oz)   Height:   5' 8.5" (1.74 m)    Physical Exam: General Appearance: Well developed, well nourished, pleasant, laying in bed in no discomfort Lungs: Normal WOB without any wheezing or crackles. Cardiovascular: Normal rate, regular rhythm, no m/r/g. Peripheral pulses 1+ and symmetric. Abdomen: soft and nontender with normal bowel sounds and no suprapubic fullness. Suprapubic catheter in place with no surrounding ecchymosis or swelling. GU: clear yellow urine in catheter bag  Labs: AM BMP to follow  Imaging: 4/14 MRI brain wo contrast: 1. No acute finding. 2. Advanced chronic small vessel ischemia. 3. Remote small vessel infarcts in the bilateral cerebellum, left occipital cortex, and left posterior frontal cortex.  4/15 renal US: 1. No evidence of hydronephrosis. 2. Mildly increased renal parenchymal echogenicity raises concern for medical renal disease. 3. Mildly distended bladder, raising question for  bladder outlet obstruction. Would correlate for associated symptoms.  Assessment/Plan:  Principal Problem:   Altered mental status Active Problems:   Hyperglycemia   Urinary retention  Elijah Castro is a 77 y.o. M with PMH recently diagnosed pancreatic adenocarcinoma, CVA, CAD, DM2, HLD, HTN and BPH presenting with transient altered mental status manifested by staring and unresponsiveness with no acute process visualized on MRI. He is currently at his baseline level of functioning with discharge currently being coordinated. He has had no complications since receiving suprapubic catheter placement for urinary obstruction.  Transient AMS: Transient symptoms in the setting of poor oral intake and positive orthostatic vitals suggest syncope in the setting of dehydration vs hypoglycemia. Stroke and CNS mets ruled out with MRI brain. EEG not performed as pt has been at baseline since arrival to ED. No chest pain or consistent arrhythmia to suggest cardiac etiology.  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 Renal US with no hydronephrosis.  - discontinue terazosin and finasteride given risk for dizziness - outpt suprapubic catheter replacement in 6 wks per urology - outpt evaluation of urethral stricture  Normal anion gap metabolic acidosis with hypokalemia: Likely diuresis secondary to relief of urinary obstruction in conjunction with poor fluid intake. - replete K - IV fluid  resuscitation  History of CVA: MRI confirms small vessel of ischemia and remote infarcts. No focal neurological deficits deviating from baseline at this time. - continue ASA 81 mg po qd, atorvastatin 80 mg  po qd  Pancreatic adenocarcinoma: Candidate for resection and neoadjuvant chemotherapy [facility]. Takes megestrol for appetite stimulation, but this has limited efficacy and may contribute to AMS. We will defer to oncology for management of appetite stimulation.  - hold megestrol - appt for port placement in [facility] 4/22, onc visit 4/30  DM2: A1c 8.5. Glucose <200 over last 24h. Patient is at increased risk of hypoglycemia given poor oral intake, so we will not discharge with insulin. - metformin 500 mg po bid  Mild neutropenia: WBC 3.2, ANC 1.1. Likely acute given WBC 6.4 in 10/2017, making stable ethnic neutropenia less likely. Favor malnutrition given poor po intake. Current meds not consistent with agranulocytosis. No infectious symptoms. - review smear  FEN/GI: F: NS 75 mL/hr E: replete K N: carb modified GI: none needed at this time  DVT PPX: SCDs  Dispo: Patient is medically ready for discharge. May return home with 24h supervision per last OT evaluation. He will have supervision in [place] beginning this evening through the weekend before going to [place].  Sarina Ser, Medical Student 02/19/2018, 7:19 AM Pager: [phone]  Attestation for Student Documentation:  I personally was present and performed or re-performed the history, physical exam and medical decision-making activities of this service and have verified that the service and findings are accurately documented in the student's note.  Ledell Noss, MD 02/19/2018, 11:30 AM

## 2018-03-20 ENCOUNTER — Emergency Department (HOSPITAL_COMMUNITY): Payer: Medicare Other

## 2018-03-20 ENCOUNTER — Inpatient Hospital Stay (HOSPITAL_COMMUNITY)
Admission: EM | Admit: 2018-03-20 | Discharge: 2018-03-23 | DRG: 698 | Disposition: A | Discharge status: Home Health | Payer: Medicare Other | Attending: Family Medicine | Admitting: Family Medicine

## 2018-03-20 ENCOUNTER — Encounter (HOSPITAL_COMMUNITY): Payer: Self-pay

## 2018-03-20 DIAGNOSIS — D61810 Antineoplastic chemotherapy induced pancytopenia: Secondary | ICD-10-CM | POA: Diagnosis present

## 2018-03-20 DIAGNOSIS — N390 Urinary tract infection, site not specified: Secondary | ICD-10-CM | POA: Diagnosis present

## 2018-03-20 DIAGNOSIS — Z794 Long term (current) use of insulin: Secondary | ICD-10-CM | POA: Diagnosis not present

## 2018-03-20 DIAGNOSIS — Z833 Family history of diabetes mellitus: Secondary | ICD-10-CM | POA: Diagnosis not present

## 2018-03-20 DIAGNOSIS — D61818 Other pancytopenia: Secondary | ICD-10-CM

## 2018-03-20 DIAGNOSIS — E119 Type 2 diabetes mellitus without complications: Secondary | ICD-10-CM | POA: Diagnosis not present

## 2018-03-20 DIAGNOSIS — Z9359 Other cystostomy status: Secondary | ICD-10-CM

## 2018-03-20 DIAGNOSIS — Y846 Urinary catheterization as the cause of abnormal reaction of the patient, or of later complication, without mention of misadventure at the time of the procedure: Secondary | ICD-10-CM | POA: Diagnosis present

## 2018-03-20 DIAGNOSIS — B952 Enterococcus as the cause of diseases classified elsewhere: Secondary | ICD-10-CM | POA: Diagnosis present

## 2018-03-20 DIAGNOSIS — Z7189 Other specified counseling: Secondary | ICD-10-CM

## 2018-03-20 DIAGNOSIS — E1165 Type 2 diabetes mellitus with hyperglycemia: Secondary | ICD-10-CM | POA: Diagnosis present

## 2018-03-20 DIAGNOSIS — F0391 Unspecified dementia with behavioral disturbance: Secondary | ICD-10-CM | POA: Diagnosis not present

## 2018-03-20 DIAGNOSIS — E785 Hyperlipidemia, unspecified: Secondary | ICD-10-CM | POA: Diagnosis present

## 2018-03-20 DIAGNOSIS — Y92230 Patient room in hospital as the place of occurrence of the external cause: Secondary | ICD-10-CM | POA: Diagnosis present

## 2018-03-20 DIAGNOSIS — G9340 Encephalopathy, unspecified: Secondary | ICD-10-CM | POA: Diagnosis present

## 2018-03-20 DIAGNOSIS — I110 Hypertensive heart disease with heart failure: Secondary | ICD-10-CM | POA: Diagnosis present

## 2018-03-20 DIAGNOSIS — R6510 Systemic inflammatory response syndrome (SIRS) of non-infectious origin without acute organ dysfunction: Secondary | ICD-10-CM | POA: Diagnosis present

## 2018-03-20 DIAGNOSIS — I509 Heart failure, unspecified: Secondary | ICD-10-CM | POA: Diagnosis present

## 2018-03-20 DIAGNOSIS — N401 Enlarged prostate with lower urinary tract symptoms: Secondary | ICD-10-CM | POA: Diagnosis present

## 2018-03-20 DIAGNOSIS — Z79899 Other long term (current) drug therapy: Secondary | ICD-10-CM | POA: Diagnosis not present

## 2018-03-20 DIAGNOSIS — Z7982 Long term (current) use of aspirin: Secondary | ICD-10-CM | POA: Diagnosis not present

## 2018-03-20 DIAGNOSIS — F0390 Unspecified dementia without behavioral disturbance: Secondary | ICD-10-CM | POA: Diagnosis present

## 2018-03-20 DIAGNOSIS — N179 Acute kidney failure, unspecified: Secondary | ICD-10-CM | POA: Diagnosis present

## 2018-03-20 DIAGNOSIS — E860 Dehydration: Secondary | ICD-10-CM | POA: Diagnosis present

## 2018-03-20 DIAGNOSIS — G9349 Other encephalopathy: Secondary | ICD-10-CM | POA: Diagnosis present

## 2018-03-20 DIAGNOSIS — I2510 Atherosclerotic heart disease of native coronary artery without angina pectoris: Secondary | ICD-10-CM | POA: Diagnosis present

## 2018-03-20 DIAGNOSIS — Z8673 Personal history of transient ischemic attack (TIA), and cerebral infarction without residual deficits: Secondary | ICD-10-CM | POA: Diagnosis not present

## 2018-03-20 DIAGNOSIS — C259 Malignant neoplasm of pancreas, unspecified: Secondary | ICD-10-CM | POA: Diagnosis present

## 2018-03-20 DIAGNOSIS — Z515 Encounter for palliative care: Secondary | ICD-10-CM | POA: Diagnosis not present

## 2018-03-20 DIAGNOSIS — Z87891 Personal history of nicotine dependence: Secondary | ICD-10-CM | POA: Diagnosis not present

## 2018-03-20 DIAGNOSIS — D649 Anemia, unspecified: Secondary | ICD-10-CM | POA: Diagnosis present

## 2018-03-20 DIAGNOSIS — F329 Major depressive disorder, single episode, unspecified: Secondary | ICD-10-CM | POA: Diagnosis present

## 2018-03-20 DIAGNOSIS — T83518A Infection and inflammatory reaction due to other urinary catheter, initial encounter: Secondary | ICD-10-CM | POA: Diagnosis present

## 2018-03-20 DIAGNOSIS — T451X5A Adverse effect of antineoplastic and immunosuppressive drugs, initial encounter: Secondary | ICD-10-CM | POA: Diagnosis present

## 2018-03-20 DIAGNOSIS — A419 Sepsis, unspecified organism: Secondary | ICD-10-CM

## 2018-03-20 DIAGNOSIS — Z9183 Wandering in diseases classified elsewhere: Secondary | ICD-10-CM | POA: Diagnosis not present

## 2018-03-20 DIAGNOSIS — I10 Essential (primary) hypertension: Secondary | ICD-10-CM | POA: Diagnosis not present

## 2018-03-20 LAB — URINALYSIS, ROUTINE W REFLEX MICROSCOPIC
BILIRUBIN URINE: NEGATIVE
Glucose, UA: >=500 mg/dL — AB
Ketones, ur: 80 mg/dL — AB
Nitrite: NEGATIVE
PH: 5 (ref 5.0–8.0)
Protein, ur: 100 mg/dL — AB
SPECIFIC GRAVITY, URINE: 1.023 (ref 1.005–1.030)
WBC, UA: >50 WBC/hpf — ABNORMAL HIGH (ref 0–5)

## 2018-03-20 LAB — I-STAT ARTERIAL BLOOD GAS, ED
Acid-base deficit: 5 mmol/L — ABNORMAL HIGH (ref 0.0–2.0)
Bicarbonate: 18.4 mmol/L — ABNORMAL LOW (ref 20.0–28.0)
O2 SAT: 98 %
PCO2 ART: 28.5 mmHg — AB (ref 32.0–48.0)
PH ART: 7.415 (ref 7.350–7.450)
Patient temperature: 98
TCO2: 19 mmol/L — ABNORMAL LOW (ref 22–32)
pO2, Arterial: 105 mmHg (ref 83.0–108.0)

## 2018-03-20 LAB — CBC WITH DIFFERENTIAL/PLATELET
ABS IMMATURE GRANULOCYTES: 0 10*3/uL (ref 0.0–0.1)
BASOS PCT: 1 %
Basophils Absolute: 0 10*3/uL (ref 0.0–0.1)
Eosinophils Absolute: 0 10*3/uL (ref 0.0–0.7)
Eosinophils Relative: 1 %
HCT: 32.2 % — ABNORMAL LOW (ref 39.0–52.0)
Hemoglobin: 10.6 g/dL — ABNORMAL LOW (ref 13.0–17.0)
IMMATURE GRANULOCYTES: 0 %
LYMPHS ABS: 1.2 10*3/uL (ref 0.7–4.0)
Lymphocytes Relative: 28 %
MCH: 29.4 pg (ref 26.0–34.0)
MCHC: 32.9 g/dL (ref 30.0–36.0)
MCV: 89.4 fL (ref 78.0–100.0)
Monocytes Absolute: 0.3 10*3/uL (ref 0.1–1.0)
Monocytes Relative: 7 %
NEUTROS ABS: 2.7 10*3/uL (ref 1.7–7.7)
NEUTROS PCT: 63 %
PLATELETS: 150 10*3/uL (ref 150–400)
RBC: 3.6 MIL/uL — AB (ref 4.22–5.81)
RDW: 14.5 % (ref 11.5–15.5)
WBC: 4.2 10*3/uL (ref 4.0–10.5)

## 2018-03-20 LAB — COMPREHENSIVE METABOLIC PANEL
ALT: 12 U/L — ABNORMAL LOW (ref 17–63)
ANION GAP: 16 — AB (ref 5–15)
AST: 18 U/L (ref 15–41)
Albumin: 3.6 g/dL (ref 3.5–5.0)
Alkaline Phosphatase: 51 U/L (ref 38–126)
BUN: 21 mg/dL — ABNORMAL HIGH (ref 6–20)
CALCIUM: 9.3 mg/dL (ref 8.9–10.3)
CHLORIDE: 100 mmol/L — AB (ref 101–111)
CO2: 17 mmol/L — AB (ref 22–32)
Creatinine, Ser: 1.47 mg/dL — ABNORMAL HIGH (ref 0.61–1.24)
GFR, EST AFRICAN AMERICAN: 52 mL/min — AB (ref >60)
GFR, EST NON AFRICAN AMERICAN: 45 mL/min — AB (ref >60)
Glucose, Bld: 355 mg/dL — ABNORMAL HIGH (ref 65–99)
Potassium: 5.1 mmol/L (ref 3.5–5.1)
SODIUM: 133 mmol/L — AB (ref 135–145)
Total Bilirubin: 1.5 mg/dL — ABNORMAL HIGH (ref 0.3–1.2)
Total Protein: 7.3 g/dL (ref 6.5–8.1)

## 2018-03-20 LAB — I-STAT CG4 LACTIC ACID, ED: LACTIC ACID, VENOUS: 1.75 mmol/L (ref 0.5–1.9)

## 2018-03-20 LAB — POC OCCULT BLOOD, ED: Fecal Occult Bld: NEGATIVE

## 2018-03-20 MED ORDER — VANCOMYCIN HCL IN DEXTROSE 1-5 GM/200ML-% IV SOLN: 1000.0000 mg | Freq: Once | INTRAVENOUS | Status: DC

## 2018-03-20 MED ORDER — THIAMINE HCL 100 MG/ML IJ SOLN
100.0000 mg | Freq: Once | INTRAMUSCULAR | Status: AC
  Administered 2018-03-20: 100 mg via INTRAVENOUS
  Filled 2018-03-20: qty 2

## 2018-03-20 MED ORDER — VANCOMYCIN HCL IN DEXTROSE 1-5 GM/200ML-% IV SOLN: 1000.0000 mg | INTRAVENOUS | Status: DC

## 2018-03-20 MED ORDER — PIPERACILLIN-TAZOBACTAM 3.375 G IVPB 30 MIN
3.3750 g | Freq: Once | INTRAVENOUS | Status: AC
  Administered 2018-03-20: 3.375 g via INTRAVENOUS
  Filled 2018-03-20: qty 50

## 2018-03-20 MED ORDER — SODIUM CHLORIDE 0.9 % IV BOLUS
1000.0000 mL | Freq: Once | INTRAVENOUS | Status: AC
  Administered 2018-03-20: 1000 mL via INTRAVENOUS

## 2018-03-20 MED ORDER — PIPERACILLIN-TAZOBACTAM 3.375 G IVPB
3.3750 g | Freq: Three times a day (TID) | INTRAVENOUS | Status: DC
  Administered 2018-03-21: 3.375 g via INTRAVENOUS
  Filled 2018-03-20 (×2): qty 50

## 2018-03-20 MED ORDER — VANCOMYCIN HCL 10 G IV SOLR
1500.0000 mg | Freq: Once | INTRAVENOUS | Status: AC
  Administered 2018-03-20: 1500 mg via INTRAVENOUS
  Filled 2018-03-20: qty 1500

## 2018-03-20 NOTE — Progress Notes (Signed)
Pharmacy Antibiotic Note  Elijah Castro is a 77 y.o. male admitted on 03/20/2018 with sepsis.  Pharmacy has been consulted for vancomycin and zosyn dosing. Pt is afebrile and WBC is WNL. SCr is elevated at 1.47 and lactic acid is 1.75.   Plan: Vancomycin 1500mg  IV x 1 then 1gm IV Q24H Zosyn 3.375gm IV Q8H (4 hr inf) F/u renal fxn, C&S, clinical status and trough at SS  Height: 5\' 9"  (175.3 cm) Weight: 165 lb 9.1 oz (75.1 kg) IBW/kg (Calculated) : 70.7  Temp (24hrs), Avg:97.9 F (36.6 C), Min:97.9 F (36.6 C), Max:97.9 F (36.6 C)  No results for input(s): WBC, CREATININE, LATICACIDVEN, VANCOTROUGH, VANCOPEAK, VANCORANDOM, GENTTROUGH, GENTPEAK, GENTRANDOM, TOBRATROUGH, TOBRAPEAK, TOBRARND, AMIKACINPEAK, AMIKACINTROU, AMIKACIN in the last 168 hours.  CrCl cannot be calculated (Patient's most recent lab result is older than the maximum 21 days allowed.).    No Known Allergies  Antimicrobials this admission: Vanc 5/17>> Zosyn 5/17>>  Dose adjustments this admission: N/A  Microbiology results: Pending  Thank you for allowing pharmacy to be a part of this patient's care.  Aquita Simmering, Rande Lawman 03/20/2018 8:10 PM

## 2018-03-20 NOTE — ED Notes (Signed)
Daughter, Yi Haugan, [phone]

## 2018-03-20 NOTE — ED Notes (Signed)
Attempted to call report to floor 

## 2018-03-20 NOTE — ED Triage Notes (Addendum)
Pt arrived via [facility]; per EMS pt from home found wandering outside without clothes by family with whom he lives with; Pt A/Ox1; Pt recently dx with Pancreatic CA in 3/19 and receiving Chemo tx; Pt has decreased eating and drinkking per family; Hx of DM; 20G LAC; Pt has suprapubic cath placed 4/16 at [facility]; 118/72, 114, 16, 100% on 3L ; CBG 298

## 2018-03-20 NOTE — ED Provider Notes (Signed)
[facility] EMERGENCY DEPARTMENT Provider Note   CSN: [id number] Arrival date & time: 03/20/18  1911     History   Chief Complaint Chief Complaint  Patient presents with  . Altered Mental Status    HPI Elijah Castro is a 77 y.o. male.  Level 5 caveat dementia  HPI history is obtained from his daughter.  She has been found outside walking in his underwear earlier today.  He is also been eating less over the past week.  No known fever.  No other associated symptoms.  No treatment prior to coming here.  Patient denies pain anywhere.  Daughter denies cough.  He looks improved presently over earlier today.  He underwent chemotherapy last week for pancreatic cancer.  Past Medical History:  Diagnosis Date  . BPH (benign prostatic hyperplasia)   . CHF (congestive heart failure) ([place])   . Coronary artery disease   . Diabetes mellitus without complication ([place])   . Hyperlipidemia   . Hypertension     Patient Active Problem List   Diagnosis Date Noted  . Urinary retention 02/17/2018  . Altered mental status 02/15/2018  . Hyperglycemia 05/07/2016    Past Surgical History:  Procedure Laterality Date  . JOINT REPLACEMENT          Home Medications    Prior to Admission medications   Medication Sig Start Date End Date Taking? Authorizing Provider  acetaminophen (TYLENOL) 500 MG tablet Take 1,000 mg by mouth as needed for mild pain.    [provider]  aspirin 81 MG chewable tablet Chew 81 mg by mouth daily.    [provider]  atorvastatin (LIPITOR) 80 MG tablet Take 40 mg by mouth at bedtime.    [provider]  finasteride (PROSCAR) 5 MG tablet Take 5 mg by mouth daily.    [provider]  insulin aspart (NOVOLOG) 100 UNIT/ML injection Inject 5 Units into the skin See admin instructions. Three times a day if blood sugar is over 110 prior to meals    [provider]  lisinopril (PRINIVIL,ZESTRIL) 5 MG tablet Take  1 tablet (5 mg total) by mouth daily. 02/19/18 02/19/19  Ledell Noss, MD  metFORMIN (GLUCOPHAGE) 500 MG tablet Take 1 tablet (500 mg total) by mouth 2 (two) times daily with a meal. 02/19/18   Ledell Noss, MD  metoprolol tartrate (LOPRESSOR) 25 MG tablet Take 12.5 mg by mouth 2 (two) times daily.    [provider]  ondansetron (ZOFRAN-ODT) 4 MG disintegrating tablet Take 4 mg by mouth every 6 (six) hours as needed for nausea/vomiting. 01/27/18   [provider]  sildenafil (VIAGRA) 100 MG tablet Take 100 mg by mouth daily as needed for erectile dysfunction.    [provider]  terazosin (HYTRIN) 2 MG capsule Take 2 mg by mouth every evening.    [provider]    Family History Family History  Problem Relation Age of Onset  . Diabetes Mellitus II Daughter     Social History Social History   Tobacco Use  . Smoking status: Former Research scientist (life sciences)  . Smokeless tobacco: Never Used  Substance Use Topics  . Alcohol use: No  . Drug use: No     Allergies   Patient has no known allergies.   Review of Systems Review of Systems  Unable to perform ROS: Dementia  Genitourinary:       Suprapubic catheter  Allergic/Immunologic: Positive for immunocompromised state.       Cancer patient  on chemotherapy     Physical Exam Updated Vital Signs BP 106/62   Pulse 90   Temp (!) 100.7 F (38.2 C) (Rectal)   Resp (!) 22   Ht 5\' 9"  (1.753 m)   Wt 75.1 kg (165 lb 9.1 oz)   SpO2 100%   BMI 24.45 kg/m   Physical Exam  Constitutional:  Cachectic, chronically ill-appearing  HENT:  Head: Normocephalic and atraumatic.  Mucous membranes dry  Eyes: Pupils are equal, round, and reactive to light. Conjunctivae are normal.  Neck: Neck supple. No tracheal deviation present. No thyromegaly present.  Cardiovascular: Regular rhythm.  No murmur heard. Tachycardic mild  Pulmonary/Chest: Effort normal and breath sounds normal.  Port-A-Cath present at right anterior chest site  not red warm or tender  Abdominal: Soft. Bowel sounds are normal. He exhibits no distension. There is no tenderness.  Suprapubic catheter in place.  Surrounding site not red or warm or tender  Musculoskeletal: Normal range of motion. He exhibits no edema or tenderness.  Neurological: He is alert. Coordination normal.  Moves all extremities cranial nerves II through XII grossly intact  Skin: Skin is warm and dry. No rash noted.  Psychiatric: He has a normal mood and affect.  Nursing note and vitals reviewed.    ED Treatments / Results  Labs (all labs ordered are listed, but only abnormal results are displayed) Labs Reviewed  COMPREHENSIVE METABOLIC PANEL - Abnormal; Notable for the following components:      Result Value   Sodium 133 (*)    Chloride 100 (*)    CO2 17 (*)    Glucose, Bld 355 (*)    BUN 21 (*)    Creatinine, Ser 1.47 (*)    ALT 12 (*)    Total Bilirubin 1.5 (*)    GFR calc non Af Amer 45 (*)    GFR calc Af Amer 52 (*)    Anion gap 16 (*)    All other components within normal limits  CBC WITH DIFFERENTIAL/PLATELET - Abnormal; Notable for the following components:   RBC 3.60 (*)    Hemoglobin 10.6 (*)    HCT 32.2 (*)    All other components within normal limits  CULTURE, BLOOD (ROUTINE X 2)  CULTURE, BLOOD (ROUTINE X 2)  URINE CULTURE  URINALYSIS, ROUTINE W REFLEX MICROSCOPIC  BLOOD GAS, ARTERIAL  I-STAT CG4 LACTIC ACID, ED  POC OCCULT BLOOD, ED    EKG EKG Interpretation  Date/Time:  Friday Mar 20 2018 19:24:31 EDT Ventricular Rate:  108 PR Interval:    QRS Duration: 76 QT Interval:  361 QTC Calculation: 484 R Axis:   44 Text Interpretation:  Sinus tachycardia Low voltage, extremity leads Borderline prolonged QT interval SINCE LAST TRACING HEART RATE HAS INCREASED Confirmed by Orlie Dakin [phone]) on 03/20/2018 7:49:02 PM   Radiology Dg Chest Port 1 View  Result Date: 03/20/2018 CLINICAL DATA:  Altered mental status, sepsis. History of  pancreatic cancer, on chemotherapy. EXAM: PORTABLE CHEST 1 VIEW COMPARISON:  Chest radiograph February 15, 2018 FINDINGS: Cardiomediastinal silhouette is normal. No pleural effusions or focal consolidations. Trachea projects midline and there is no pneumothorax. Soft tissue planes and included osseous structures are non-suspicious. Single lumen RIGHT chest Port-A-Cath distal tip projects in distal superior vena cava. IMPRESSION: No acute cardiopulmonary process. Electronically Signed   By: Elon Alas M.D.   On: 03/20/2018 20:33    Procedures Procedures (including critical care time)  Medications Ordered in ED Medications  vancomycin (VANCOCIN) 1,500  mg in sodium chloride 0.9 % 500 mL IVPB (1,500 mg Intravenous New Bag/Given 03/20/18 2059)  vancomycin (VANCOCIN) IVPB 1000 mg/200 mL premix (has no administration in time range)  piperacillin-tazobactam (ZOSYN) IVPB 3.375 g (has no administration in time range)  piperacillin-tazobactam (ZOSYN) IVPB 3.375 g (0 g Intravenous Stopped 03/20/18 2127)    Results for orders placed or performed during the hospital encounter of 03/20/18  Comprehensive metabolic panel  Result Value Ref Range   Sodium 133 (L) 135 - 145 mmol/L   Potassium 5.1 3.5 - 5.1 mmol/L   Chloride 100 (L) 101 - 111 mmol/L   CO2 17 (L) 22 - 32 mmol/L   Glucose, Bld 355 (H) 65 - 99 mg/dL   BUN 21 (H) 6 - 20 mg/dL   Creatinine, Ser 1.47 (H) 0.61 - 1.24 mg/dL   Calcium 9.3 8.9 - 10.3 mg/dL   Total Protein 7.3 6.5 - 8.1 g/dL   Albumin 3.6 3.5 - 5.0 g/dL   AST 18 15 - 41 U/L   ALT 12 (L) 17 - 63 U/L   Alkaline Phosphatase 51 38 - 126 U/L   Total Bilirubin 1.5 (H) 0.3 - 1.2 mg/dL   GFR calc non Af Amer 45 (L) >60 mL/min   GFR calc Af Amer 52 (L) >60 mL/min   Anion gap 16 (H) 5 - 15  CBC WITH DIFFERENTIAL  Result Value Ref Range   WBC 4.2 4.0 - 10.5 K/uL   RBC 3.60 (L) 4.22 - 5.81 MIL/uL   Hemoglobin 10.6 (L) 13.0 - 17.0 g/dL   HCT 32.2 (L) 39.0 - 52.0 %   MCV 89.4 78.0 -  100.0 fL   MCH 29.4 26.0 - 34.0 pg   MCHC 32.9 30.0 - 36.0 g/dL   RDW 14.5 11.5 - 15.5 %   Platelets 150 150 - 400 K/uL   Neutrophils Relative % 63 %   Neutro Abs 2.7 1.7 - 7.7 K/uL   Lymphocytes Relative 28 %   Lymphs Abs 1.2 0.7 - 4.0 K/uL   Monocytes Relative 7 %   Monocytes Absolute 0.3 0.1 - 1.0 K/uL   Eosinophils Relative 1 %   Eosinophils Absolute 0.0 0.0 - 0.7 K/uL   Basophils Relative 1 %   Basophils Absolute 0.0 0.0 - 0.1 K/uL   Immature Granulocytes 0 %   Abs Immature Granulocytes 0.0 0.0 - 0.1 K/uL  I-Stat CG4 Lactic Acid, ED  (not at  [facility])  Result Value Ref Range   Lactic Acid, Venous 1.75 0.5 - 1.9 mmol/L  POC occult blood, ED  Result Value Ref Range   Fecal Occult Bld NEGATIVE NEGATIVE   Dg Chest Port 1 View  Result Date: 03/20/2018 CLINICAL DATA:  Altered mental status, sepsis. History of pancreatic cancer, on chemotherapy. EXAM: PORTABLE CHEST 1 VIEW COMPARISON:  Chest radiograph February 15, 2018 FINDINGS: Cardiomediastinal silhouette is normal. No pleural effusions or focal consolidations. Trachea projects midline and there is no pneumothorax. Soft tissue planes and included osseous structures are non-suspicious. Single lumen RIGHT chest Port-A-Cath distal tip projects in distal superior vena cava. IMPRESSION: No acute cardiopulmonary process. Electronically Signed   By: Elon Alas M.D.   On: 03/20/2018 20:33  X-ray viewed by me Results for orders placed or performed during the hospital encounter of 03/20/18  Comprehensive metabolic panel  Result Value Ref Range   Sodium 133 (L) 135 - 145 mmol/L   Potassium 5.1 3.5 - 5.1 mmol/L   Chloride 100 (L) 101 - 111  mmol/L   CO2 17 (L) 22 - 32 mmol/L   Glucose, Bld 355 (H) 65 - 99 mg/dL   BUN 21 (H) 6 - 20 mg/dL   Creatinine, Ser 1.47 (H) 0.61 - 1.24 mg/dL   Calcium 9.3 8.9 - 10.3 mg/dL   Total Protein 7.3 6.5 - 8.1 g/dL   Albumin 3.6 3.5 - 5.0 g/dL   AST 18 15 - 41 U/L   ALT 12 (L) 17 - 63 U/L   Alkaline  Phosphatase 51 38 - 126 U/L   Total Bilirubin 1.5 (H) 0.3 - 1.2 mg/dL   GFR calc non Af Amer 45 (L) >60 mL/min   GFR calc Af Amer 52 (L) >60 mL/min   Anion gap 16 (H) 5 - 15  CBC WITH DIFFERENTIAL  Result Value Ref Range   WBC 4.2 4.0 - 10.5 K/uL   RBC 3.60 (L) 4.22 - 5.81 MIL/uL   Hemoglobin 10.6 (L) 13.0 - 17.0 g/dL   HCT 32.2 (L) 39.0 - 52.0 %   MCV 89.4 78.0 - 100.0 fL   MCH 29.4 26.0 - 34.0 pg   MCHC 32.9 30.0 - 36.0 g/dL   RDW 14.5 11.5 - 15.5 %   Platelets 150 150 - 400 K/uL   Neutrophils Relative % 63 %   Neutro Abs 2.7 1.7 - 7.7 K/uL   Lymphocytes Relative 28 %   Lymphs Abs 1.2 0.7 - 4.0 K/uL   Monocytes Relative 7 %   Monocytes Absolute 0.3 0.1 - 1.0 K/uL   Eosinophils Relative 1 %   Eosinophils Absolute 0.0 0.0 - 0.7 K/uL   Basophils Relative 1 %   Basophils Absolute 0.0 0.0 - 0.1 K/uL   Immature Granulocytes 0 %   Abs Immature Granulocytes 0.0 0.0 - 0.1 K/uL  Urinalysis, Routine w reflex microscopic  Result Value Ref Range   Color, Urine AMBER (A) YELLOW   APPearance CLOUDY (A) CLEAR   Specific Gravity, Urine 1.023 1.005 - 1.030   pH 5.0 5.0 - 8.0   Glucose, UA >=500 (A) NEGATIVE mg/dL   Hgb urine dipstick MODERATE (A) NEGATIVE   Bilirubin Urine NEGATIVE NEGATIVE   Ketones, ur 80 (A) NEGATIVE mg/dL   Protein, ur 100 (A) NEGATIVE mg/dL   Nitrite NEGATIVE NEGATIVE   Leukocytes, UA LARGE (A) NEGATIVE   RBC / HPF 21-50 0 - 5 RBC/hpf   WBC, UA >50 (H) 0 - 5 WBC/hpf   Bacteria, UA FEW (A) NONE SEEN   Squamous Epithelial / LPF 0-5 0 - 5   Mucus PRESENT    Hyaline Casts, UA PRESENT   I-Stat CG4 Lactic Acid, ED  (not at  [facility])  Result Value Ref Range   Lactic Acid, Venous 1.75 0.5 - 1.9 mmol/L  POC occult blood, ED  Result Value Ref Range   Fecal Occult Bld NEGATIVE NEGATIVE  I-Stat arterial blood gas, ED  Result Value Ref Range   pH, Arterial 7.415 7.350 - 7.450   pCO2 arterial 28.5 (L) 32.0 - 48.0 mmHg   pO2, Arterial 105.0 83.0 - 108.0 mmHg    Bicarbonate 18.4 (L) 20.0 - 28.0 mmol/L   TCO2 19 (L) 22 - 32 mmol/L   O2 Saturation 98.0 %   Acid-base deficit 5.0 (H) 0.0 - 2.0 mmol/L   Patient temperature 98.0 F    Collection site RADIAL, ALLEN'S TEST ACCEPTABLE    Drawn by Operator    Sample type ARTERIAL    Dg Chest [facility] 1 View  Result  Date: 03/20/2018 CLINICAL DATA:  Altered mental status, sepsis. History of pancreatic cancer, on chemotherapy. EXAM: PORTABLE CHEST 1 VIEW COMPARISON:  Chest radiograph February 15, 2018 FINDINGS: Cardiomediastinal silhouette is normal. No pleural effusions or focal consolidations. Trachea projects midline and there is no pneumothorax. Soft tissue planes and included osseous structures are non-suspicious. Single lumen RIGHT chest Port-A-Cath distal tip projects in distal superior vena cava. IMPRESSION: No acute cardiopulmonary process. Electronically Signed   By: Elon Alas M.D.   On: 03/20/2018 20:33   Results for orders placed or performed during the hospital encounter of 03/20/18  Comprehensive metabolic panel  Result Value Ref Range   Sodium 133 (L) 135 - 145 mmol/L   Potassium 5.1 3.5 - 5.1 mmol/L   Chloride 100 (L) 101 - 111 mmol/L   CO2 17 (L) 22 - 32 mmol/L   Glucose, Bld 355 (H) 65 - 99 mg/dL   BUN 21 (H) 6 - 20 mg/dL   Creatinine, Ser 1.47 (H) 0.61 - 1.24 mg/dL   Calcium 9.3 8.9 - 10.3 mg/dL   Total Protein 7.3 6.5 - 8.1 g/dL   Albumin 3.6 3.5 - 5.0 g/dL   AST 18 15 - 41 U/L   ALT 12 (L) 17 - 63 U/L   Alkaline Phosphatase 51 38 - 126 U/L   Total Bilirubin 1.5 (H) 0.3 - 1.2 mg/dL   GFR calc non Af Amer 45 (L) >60 mL/min   GFR calc Af Amer 52 (L) >60 mL/min   Anion gap 16 (H) 5 - 15  CBC WITH DIFFERENTIAL  Result Value Ref Range   WBC 4.2 4.0 - 10.5 K/uL   RBC 3.60 (L) 4.22 - 5.81 MIL/uL   Hemoglobin 10.6 (L) 13.0 - 17.0 g/dL   HCT 32.2 (L) 39.0 - 52.0 %   MCV 89.4 78.0 - 100.0 fL   MCH 29.4 26.0 - 34.0 pg   MCHC 32.9 30.0 - 36.0 g/dL   RDW 14.5 11.5 - 15.5 %   Platelets  150 150 - 400 K/uL   Neutrophils Relative % 63 %   Neutro Abs 2.7 1.7 - 7.7 K/uL   Lymphocytes Relative 28 %   Lymphs Abs 1.2 0.7 - 4.0 K/uL   Monocytes Relative 7 %   Monocytes Absolute 0.3 0.1 - 1.0 K/uL   Eosinophils Relative 1 %   Eosinophils Absolute 0.0 0.0 - 0.7 K/uL   Basophils Relative 1 %   Basophils Absolute 0.0 0.0 - 0.1 K/uL   Immature Granulocytes 0 %   Abs Immature Granulocytes 0.0 0.0 - 0.1 K/uL  Urinalysis, Routine w reflex microscopic  Result Value Ref Range   Color, Urine AMBER (A) YELLOW   APPearance CLOUDY (A) CLEAR   Specific Gravity, Urine 1.023 1.005 - 1.030   pH 5.0 5.0 - 8.0   Glucose, UA >=500 (A) NEGATIVE mg/dL   Hgb urine dipstick MODERATE (A) NEGATIVE   Bilirubin Urine NEGATIVE NEGATIVE   Ketones, ur 80 (A) NEGATIVE mg/dL   Protein, ur 100 (A) NEGATIVE mg/dL   Nitrite NEGATIVE NEGATIVE   Leukocytes, UA LARGE (A) NEGATIVE   RBC / HPF 21-50 0 - 5 RBC/hpf   WBC, UA >50 (H) 0 - 5 WBC/hpf   Bacteria, UA FEW (A) NONE SEEN   Squamous Epithelial / LPF 0-5 0 - 5   Mucus PRESENT    Hyaline Casts, UA PRESENT   I-Stat CG4 Lactic Acid, ED  (not at  [facility])  Result Value Ref Range  Lactic Acid, Venous 1.75 0.5 - 1.9 mmol/L  POC occult blood, ED  Result Value Ref Range   Fecal Occult Bld NEGATIVE NEGATIVE  I-Stat arterial blood gas, ED  Result Value Ref Range   pH, Arterial 7.415 7.350 - 7.450   pCO2 arterial 28.5 (L) 32.0 - 48.0 mmHg   pO2, Arterial 105.0 83.0 - 108.0 mmHg   Bicarbonate 18.4 (L) 20.0 - 28.0 mmol/L   TCO2 19 (L) 22 - 32 mmol/L   O2 Saturation 98.0 %   Acid-base deficit 5.0 (H) 0.0 - 2.0 mmol/L   Patient temperature 98.0 F    Collection site RADIAL, ALLEN'S TEST ACCEPTABLE    Drawn by Operator    Sample type ARTERIAL    Dg Chest Port 1 View  Result Date: 03/20/2018 CLINICAL DATA:  Altered mental status, sepsis. History of pancreatic cancer, on chemotherapy. EXAM: PORTABLE CHEST 1 VIEW COMPARISON:  Chest radiograph February 15, 2018  FINDINGS: Cardiomediastinal silhouette is normal. No pleural effusions or focal consolidations. Trachea projects midline and there is no pneumothorax. Soft tissue planes and included osseous structures are non-suspicious. Single lumen RIGHT chest Port-A-Cath distal tip projects in distal superior vena cava. IMPRESSION: No acute cardiopulmonary process. Electronically Signed   By: Elon Alas M.D.   On: 03/20/2018 20:33   Initial Impression / Assessment and Plan / ED Course  I have reviewed the triage vital signs and the nursing notes.  Pertinent labs & imaging results that were available during my care of the patient were reviewed by me and considered in my medical decision making (see chart for details).     Work consistent with renal insufficiency, hyperglycemia and elevated anion gap Code sepsis called based on sirs criteria of temperature and heart rate.  Source of infection unclear  Patient's blood gas not consistent with diabetic ketoacidosis. Lab work is consistent with urinary tract infection. lab work consistent with hyperglycemia and renal insufficiency.  Dr. Quinn Axe consulted by me. Requests noncontrasted head CT scan.  And will arrange for overnight stay. 10:50 PM patient remains awake alert.  Patient appears in no distress. His daughter reports that he is full code.  Palliative care consult ordered.  Sepsis - Repeat Assessment  Performed at:    1050 pm  Vitals     Blood pressure 117/66, pulse 87, temperature (!) 100.7 F (38.2 C), temperature source Rectal, resp. rate 17, height 5\' 9"  (1.753 m), weight 75.1 kg (165 lb 9.1 oz), SpO2 100 %.  Heart:     Regular rate and rhythm  Lungs:    CTA  Capillary Refill:   <2 sec  Peripheral Pulse:   Radial pulse palpable  Skin:     Normal Color   Final Clinical Impressions(s) / ED Diagnoses  Diagnosis #1 sepsis Final diagnoses:  None   #2 urinary tract infection  #3 renal insufficiency #4 hyperglycemia CRITICAL  CARE Performed by: Orlie Dakin Total critical care time: 40 minutes Critical care time was exclusive of separately billable procedures and treating other patients. Critical care was necessary to treat or prevent imminent or life-threatening deterioration. Critical care was time spent personally by me on the following activities: development of treatment plan with patient and/or surrogate as well as nursing, discussions with consultants, evaluation of patient's response to treatment, examination of patient, obtaining history from patient or surrogate, ordering and performing treatments and interventions, ordering and review of laboratory studies, ordering and review of radiographic studies, pulse oximetry and re-evaluation of patient's condition. ED Discharge Orders  None       Orlie Dakin, MD 03/20/18 2259

## 2018-03-20 NOTE — Progress Notes (Signed)
New Admission Note:  Arrival Method: By bed from ED around 2330 Mental Orientation: Alert to self Telemetry: Box 1, CCMD notified Assessment: Completed Skin: Completed, refer to flowsheets IV: Left and Right AC Pain: Denies Tubes: Suprapubic Foley Safety Measures: Safety Fall Prevention Plan was given, discussed. Admission: Completed [facility] Orientation: Patient has been orientated to the room, unit and the staff. Family: None  Orders have been reviewed and implemented. Will continue to monitor the patient. Call light has been placed within reach and bed alarm has been activated.   Perry Mount, RN  Phone Number: [phone]

## 2018-03-21 ENCOUNTER — Encounter (HOSPITAL_COMMUNITY): Payer: Self-pay | Admitting: Internal Medicine

## 2018-03-21 ENCOUNTER — Inpatient Hospital Stay (HOSPITAL_COMMUNITY): Payer: Medicare Other

## 2018-03-21 DIAGNOSIS — D61818 Other pancytopenia: Secondary | ICD-10-CM

## 2018-03-21 DIAGNOSIS — Z9359 Other cystostomy status: Secondary | ICD-10-CM

## 2018-03-21 DIAGNOSIS — Z515 Encounter for palliative care: Secondary | ICD-10-CM

## 2018-03-21 DIAGNOSIS — Z7189 Other specified counseling: Secondary | ICD-10-CM

## 2018-03-21 DIAGNOSIS — E1165 Type 2 diabetes mellitus with hyperglycemia: Secondary | ICD-10-CM | POA: Diagnosis present

## 2018-03-21 DIAGNOSIS — R6510 Systemic inflammatory response syndrome (SIRS) of non-infectious origin without acute organ dysfunction: Secondary | ICD-10-CM

## 2018-03-21 DIAGNOSIS — C259 Malignant neoplasm of pancreas, unspecified: Secondary | ICD-10-CM | POA: Diagnosis present

## 2018-03-21 DIAGNOSIS — G9340 Encephalopathy, unspecified: Secondary | ICD-10-CM

## 2018-03-21 DIAGNOSIS — I10 Essential (primary) hypertension: Secondary | ICD-10-CM | POA: Insufficient documentation

## 2018-03-21 DIAGNOSIS — D649 Anemia, unspecified: Secondary | ICD-10-CM | POA: Diagnosis present

## 2018-03-21 DIAGNOSIS — N179 Acute kidney failure, unspecified: Secondary | ICD-10-CM

## 2018-03-21 DIAGNOSIS — N390 Urinary tract infection, site not specified: Secondary | ICD-10-CM

## 2018-03-21 LAB — BASIC METABOLIC PANEL
Anion gap: 11 (ref 5–15)
BUN: 16 mg/dL (ref 6–20)
CO2: 21 mmol/L — AB (ref 22–32)
CREATININE: 0.95 mg/dL (ref 0.61–1.24)
Calcium: 8.5 mg/dL — ABNORMAL LOW (ref 8.9–10.3)
Chloride: 104 mmol/L (ref 101–111)
GFR calc Af Amer: >60 mL/min (ref >60)
GFR calc non Af Amer: >60 mL/min (ref >60)
GLUCOSE: 268 mg/dL — AB (ref 65–99)
Potassium: 4 mmol/L (ref 3.5–5.1)
SODIUM: 136 mmol/L (ref 135–145)

## 2018-03-21 LAB — CBC WITH DIFFERENTIAL/PLATELET
ABS IMMATURE GRANULOCYTES: 0 10*3/uL (ref 0.0–0.1)
BASOS PCT: 0 %
Basophils Absolute: 0 10*3/uL (ref 0.0–0.1)
Eosinophils Absolute: 0.1 10*3/uL (ref 0.0–0.7)
Eosinophils Relative: 1 %
HCT: 29.1 % — ABNORMAL LOW (ref 39.0–52.0)
Hemoglobin: 9.7 g/dL — ABNORMAL LOW (ref 13.0–17.0)
IMMATURE GRANULOCYTES: 1 %
LYMPHS ABS: 1.3 10*3/uL (ref 0.7–4.0)
Lymphocytes Relative: 34 %
MCH: 29.7 pg (ref 26.0–34.0)
MCHC: 33.3 g/dL (ref 30.0–36.0)
MCV: 89 fL (ref 78.0–100.0)
MONOS PCT: 8 %
Monocytes Absolute: 0.3 10*3/uL (ref 0.1–1.0)
NEUTROS ABS: 2.1 10*3/uL (ref 1.7–7.7)
NEUTROS PCT: 56 %
Platelets: 135 10*3/uL — ABNORMAL LOW (ref 150–400)
RBC: 3.27 MIL/uL — ABNORMAL LOW (ref 4.22–5.81)
RDW: 14.5 % (ref 11.5–15.5)
WBC: 3.8 10*3/uL — ABNORMAL LOW (ref 4.0–10.5)

## 2018-03-21 LAB — ABO/RH: ABO/RH(D): A POS

## 2018-03-21 LAB — APTT: aPTT: 62 seconds — ABNORMAL HIGH (ref 24–36)

## 2018-03-21 LAB — HEPATIC FUNCTION PANEL
ALBUMIN: 3.1 g/dL — AB (ref 3.5–5.0)
ALK PHOS: 42 U/L (ref 38–126)
ALT: 11 U/L — AB (ref 17–63)
AST: 14 U/L — AB (ref 15–41)
BILIRUBIN TOTAL: 1.3 mg/dL — AB (ref 0.3–1.2)
Bilirubin, Direct: 0.2 mg/dL (ref 0.1–0.5)
Indirect Bilirubin: 1.1 mg/dL — ABNORMAL HIGH (ref 0.3–0.9)
Total Protein: 6.3 g/dL — ABNORMAL LOW (ref 6.5–8.1)

## 2018-03-21 LAB — LACTIC ACID, PLASMA
LACTIC ACID, VENOUS: 1.4 mmol/L (ref 0.5–1.9)
Lactic Acid, Venous: 0.7 mmol/L (ref 0.5–1.9)

## 2018-03-21 LAB — PROTIME-INR
INR: 1.29
PROTHROMBIN TIME: 16 s — AB (ref 11.4–15.2)

## 2018-03-21 LAB — TYPE AND SCREEN
ABO/RH(D): A POS
Antibody Screen: NEGATIVE

## 2018-03-21 LAB — GLUCOSE, CAPILLARY
Glucose-Capillary: 229 mg/dL — ABNORMAL HIGH (ref 65–99)
Glucose-Capillary: 160 mg/dL — ABNORMAL HIGH (ref 65–99)
Glucose-Capillary: 177 mg/dL — ABNORMAL HIGH (ref 65–99)
Glucose-Capillary: 209 mg/dL — ABNORMAL HIGH (ref 65–99)

## 2018-03-21 LAB — PROCALCITONIN

## 2018-03-21 MED ORDER — ASPIRIN 81 MG PO CHEW
81.0000 mg | CHEWABLE_TABLET | Freq: Every day | ORAL | Status: DC
  Administered 2018-03-23: 81 mg via ORAL
  Filled 2018-03-21 (×3): qty 1

## 2018-03-21 MED ORDER — TERAZOSIN HCL 2 MG PO CAPS
2.0000 mg | ORAL_CAPSULE | Freq: Every evening | ORAL | Status: DC
Start: 2018-03-21 — End: 2018-03-23
  Administered 2018-03-23: 2 mg via ORAL
  Filled 2018-03-21 (×3): qty 1

## 2018-03-21 MED ORDER — ONDANSETRON HCL 4 MG/2ML IJ SOLN: 4.0000 mg | Freq: Four times a day (QID) | INTRAMUSCULAR | Status: DC | PRN

## 2018-03-21 MED ORDER — ACETAMINOPHEN 325 MG PO TABS: 650.0000 mg | ORAL_TABLET | Freq: Four times a day (QID) | ORAL | Status: DC | PRN

## 2018-03-21 MED ORDER — SODIUM CHLORIDE 0.9 % IV BOLUS
500.0000 mL | Freq: Once | INTRAVENOUS | Status: AC
  Administered 2018-03-21: 500 mL via INTRAVENOUS

## 2018-03-21 MED ORDER — ACETAMINOPHEN 650 MG RE SUPP: 650.0000 mg | Freq: Four times a day (QID) | RECTAL | Status: DC | PRN

## 2018-03-21 MED ORDER — ENOXAPARIN SODIUM 40 MG/0.4ML ~~LOC~~ SOLN
40.0000 mg | SUBCUTANEOUS | Status: DC
  Administered 2018-03-21: 40 mg via SUBCUTANEOUS
  Filled 2018-03-21: qty 0.4

## 2018-03-21 MED ORDER — ATORVASTATIN CALCIUM 40 MG PO TABS
40.0000 mg | ORAL_TABLET | Freq: Every day | ORAL | Status: DC
  Administered 2018-03-21 – 2018-03-22 (×3): 40 mg via ORAL
  Filled 2018-03-21 (×3): qty 1

## 2018-03-21 MED ORDER — FINASTERIDE 5 MG PO TABS
5.0000 mg | ORAL_TABLET | Freq: Every day | ORAL | Status: DC
  Administered 2018-03-23: 5 mg via ORAL
  Filled 2018-03-21 (×3): qty 1

## 2018-03-21 MED ORDER — ONDANSETRON HCL 4 MG PO TABS: 4.0000 mg | ORAL_TABLET | Freq: Four times a day (QID) | ORAL | Status: DC | PRN

## 2018-03-21 MED ORDER — SODIUM CHLORIDE 0.9 % IV SOLN
1.0000 g | Freq: Two times a day (BID) | INTRAVENOUS | Status: DC
  Administered 2018-03-21 – 2018-03-23 (×4): 1 g via INTRAVENOUS
  Filled 2018-03-21 (×4): qty 1

## 2018-03-21 MED ORDER — SODIUM CHLORIDE 0.9 % IV SOLN
2.0000 g | Freq: Once | INTRAVENOUS | Status: AC
  Administered 2018-03-21: 2 g via INTRAVENOUS
  Filled 2018-03-21: qty 2

## 2018-03-21 MED ORDER — INSULIN ASPART 100 UNIT/ML ~~LOC~~ SOLN
0.0000 [IU] | SUBCUTANEOUS | Status: DC
  Administered 2018-03-21: 2 [IU] via SUBCUTANEOUS
  Administered 2018-03-21: 3 [IU] via SUBCUTANEOUS

## 2018-03-21 MED ORDER — INSULIN ASPART 100 UNIT/ML ~~LOC~~ SOLN
0.0000 [IU] | Freq: Three times a day (TID) | SUBCUTANEOUS | Status: DC
  Administered 2018-03-21: 3 [IU] via SUBCUTANEOUS
  Administered 2018-03-21: 2 [IU] via SUBCUTANEOUS
  Administered 2018-03-22 (×3): 3 [IU] via SUBCUTANEOUS
  Administered 2018-03-23 (×3): 2 [IU] via SUBCUTANEOUS

## 2018-03-21 MED ORDER — SODIUM CHLORIDE 0.9% FLUSH: 10.0000 mL | INTRAVENOUS | Status: DC | PRN

## 2018-03-21 MED ORDER — INSULIN ASPART 100 UNIT/ML ~~LOC~~ SOLN: 0.0000 [IU] | Freq: Three times a day (TID) | SUBCUTANEOUS | Status: DC

## 2018-03-21 MED ORDER — SODIUM CHLORIDE 0.9 % IV SOLN
INTRAVENOUS | Status: DC
  Administered 2018-03-21 (×2): via INTRAVENOUS

## 2018-03-21 MED ORDER — METOPROLOL TARTRATE 12.5 MG HALF TABLET
12.5000 mg | ORAL_TABLET | Freq: Two times a day (BID) | ORAL | Status: DC
  Administered 2018-03-21 – 2018-03-23 (×5): 12.5 mg via ORAL
  Filled 2018-03-21 (×6): qty 1

## 2018-03-21 NOTE — Progress Notes (Signed)
Patient with several attempts at getting OOB unassisted. Patient is very unsteady on his feet and forgetful. Order has been placed for telemonitoring per protocol. Dorthey Sawyer, RN

## 2018-03-21 NOTE — Progress Notes (Signed)
Unable to do admission questions due to patient being confused. Alert to self.   Eleanora Neighbor, RN

## 2018-03-21 NOTE — Progress Notes (Signed)
Paged admitting to make aware that patient has arrived to the floor.   Eleanora Neighbor, RN

## 2018-03-21 NOTE — Progress Notes (Addendum)
Pharmacy Antibiotic Note  Elijah Castro is a 77 y.o. male admitted on 03/20/2018 with UTI/ sepsis. Pt was afebrile and WBC was WNL. SCr was elevated at 1.47 and lactic acid was 1.75.   Today, [name] SCr has improved to 0.95. He was afebrile overnight. Micro data is still pending. Antibiotics today are being changed from vancomycin/zosyn to cefepime for presumed UTI.   Plan: Cefepime 2gm IV x1 has been ordered Start cefepime 1gm IV every 12 hours tonight  Vancomycin/cefepime have been stopped Follow renal function closely Follow up urine culture and blood cultures  Height: 5\' 9"  (175.3 cm) Weight: 165 lb 9.1 oz (75.1 kg) IBW/kg (Calculated) : 70.7  Temp (24hrs), Avg:98.9 F (37.2 C), Min:97.9 F (36.6 C), Max:100.7 F (38.2 C)  Recent Labs  Lab 03/20/18 1954 03/20/18 2044 03/21/18 0129 03/21/18 0254  WBC 4.2  --  3.8*  --   CREATININE 1.47*  --  0.95  --   LATICACIDVEN  --  1.75  --  0.7    Estimated Creatinine Clearance: 67.2 mL/min (by C-G formula based on SCr of 0.95 mg/dL).    No Known Allergies  Antimicrobials this admission: Vanc 5/17>>5/18 Zosyn 5/17>>5/18 Cefepime 5/18  Dose adjustments this admission: N/A  Microbiology results: Pending  Thank you for allowing pharmacy to be a part of this patient's care.  Demetrius Charity, PharmD PGY2 Oncology Pharmacy Resident  Pharmacy Phone: [phone] 03/21/2018

## 2018-03-21 NOTE — Progress Notes (Signed)
Called CT to check on status. Will not be much longer, was in a code. Transport put in.   Eleanora Neighbor, RN

## 2018-03-21 NOTE — Consult Note (Signed)
Consultation Note Date: 03/21/2018   Patient Name: Elijah Castro  DOB: 1940/12/01  MRN: [id number]  Age / Sex: 77 y.o., male  PCP: System, Pcp Not In Referring Physician: Samuella Cota, MD  Reason for Consultation: Establishing goals of care and Psychosocial/spiritual support  HPI/Patient Profile: 77 y.o. male  with past medical history of CVA, coronary artery disease, diabetes type 2, hyperlipidemia, hypertension, BPH, suprapubic catheter secondary to urinary retention, pancreatic cancer (diagnosed in March 2018) admitted on 03/20/2018 with increased confusion.  Patient was admitted from home.  He lives with his daughter, Amon Costilla.  He was found to have a urinary tract infection.  Patient was seen at the [facility] in [facility] 3 days prior to admission.  His last admission was February 15, 2018 secondary to altered mental status, questionable CVA.  He is followed closely by the [facility] hospital and is currently undergoing chemotherapy for pancreatic cancer.  His last chemotherapy was on Mar 11, 2018 and his second treatment was held  because of low white blood cells.  Patient has undergone a renal ultrasound which shows no hydronephrosis.  Creatinine is within normal limits at 0.95.  CT scan of the head was performed and reflects old infarcts, no metastatic disease was mentioned, mild volume loss.  Consult ordered for goals of care.   Clinical Assessment and Goals of Care: Met with patient, chart reviewed.  Spoke to daughter Teddy Spike.  via phone.  She shares her concern is that her father has "given up", that he is depressed.  Patient lives with Devorah and alternately 1 of his other children, daughter Otila Kluver.  Teddy Spike and Otila Kluver share healthcare decision making as well as Otila Kluver has DURABLE POWER OF ATTORNEY for financial assistance.  Deborah's  chief concern is patient is no longer eating and drinking. We  did discuss how in the setting of critical illness that people lose their appetite; bites and sips become the new normal.  She is still hoping to pursue all aggressive medical treatment to prolong his life" cure his cancer".  She feels like the behavior that she is seeing now with loss of appetite has to do with depression and she is been asking the [facility] to evaluate him for medication management to help him cope with terminal illness.  Patient himself does not completely remember why he was admitted to the hospital.  He states he thinks he was admitted because "I was depressed".  He does remember being diagnosed with pancreatic cancer but states "I am doing good". He shares with me that he is not afraid to die, that he has turned this over to God and that God will take care of things the way they are supposed to happen.  Patient at this point is unable to participate in extensive goals of care.  His healthcare proxy would be his daughter, Jodie Leiner; daughter ,Cristino Martes is co-healthcare power of attorney and she has durable power of attorney.  Patient is unmarried and has a total of 5 living children.  SUMMARY OF RECOMMENDATIONS   Continue with full scope of treatment Daughter is requesting a psychiatric consult while he is in the hospital for possible medication recommendations to help patient with his depression Patient will benefit from ongoing goals of care discussion in the setting of pancreatic cancer.  Would recommend as part of discharge planning that he be affiliated with palliative medicine provider through the [place] system.  Daughter states that they  currently   have an assigned social worker who is working with them on receiving extra assistance in the home and will reach out to them for further palliative discussions Code Status/Advance Care Planning:  Full code   Palliative Prophylaxis:   Bowel Regimen, Delirium Protocol, Eye Care, Frequent Pain Assessment, Oral Care and Turn  Reposition  Additional Recommendations (Limitations, Scope, Preferences):  Full Scope Treatment  Psycho-social/Spiritual:   Desire for further Chaplaincy support:no  Additional Recommendations: Referral to Community Resources   Prognosis:   Unable to determine  Discharge Planning: Home with Home Health      Primary Diagnoses: Present on Admission: . (Resolved) SIRS (systemic inflammatory response syndrome) (HCC) . Acute encephalopathy . Uncontrolled type 2 diabetes mellitus with hyperglycemia ([place]) . (Resolved) ARF (acute renal failure) ([place]) . (Resolved) Normochromic normocytic anemia . Pancreatic cancer ([place])   I have reviewed the medical record, interviewed the patient and family, and examined the patient. The following aspects are pertinent.  Past Medical History:  Diagnosis Date  . BPH (benign prostatic hyperplasia)   . CHF (congestive heart failure) ([place])   . Coronary artery disease   . Diabetes mellitus without complication ([place])   . Hyperlipidemia   . Hypertension    Social History   Socioeconomic History  . Marital status: Divorced    Spouse name: Not on file  . Number of children: Not on file  . Years of education: Not on file  . Highest education level: Not on file  Occupational History  . Not on file  Social Needs  . Financial resource strain: Not on file  . Food insecurity:    Worry: Not on file    Inability: Not on file  . Transportation needs:    Medical: Not on file    Non-medical: Not on file  Tobacco Use  . Smoking status: Former Research scientist (life sciences)  . Smokeless tobacco: Never Used  Substance and Sexual Activity  . Alcohol use: No  . Drug use: No  . Sexual activity: Not on file  Lifestyle  . Physical activity:    Days per week: Not on file    Minutes per session: Not on file  . Stress: Not on file  Relationships  . Social connections:    Talks on phone: Not on file    Gets together: Not on file    Attends religious service: Not on file      Active member of club or organization: Not on file    Attends meetings of clubs or organizations: Not on file    Relationship status: Not on file  Other Topics Concern  . Not on file  Social History Narrative  . Not on file   Family History  Problem Relation Age of Onset  . Diabetes Mellitus II Daughter    Scheduled Meds: . aspirin  81 mg Oral Daily  . atorvastatin  40 mg Oral QHS  . finasteride  5 mg Oral Daily  . insulin aspart  0-9 Units Subcutaneous TID WC  . metoprolol tartrate  12.5 mg Oral BID  .  terazosin  2 mg Oral QPM   Continuous Infusions: . ceFEPime (MAXIPIME) IV     PRN Meds:.acetaminophen **OR** acetaminophen, ondansetron **OR** ondansetron (ZOFRAN) IV, sodium chloride flush Medications Prior to Admission:  Prior to Admission medications   Medication Sig Start Date End Date Taking? Authorizing Provider  insulin aspart (NOVOLOG) 100 UNIT/ML injection Inject 5 Units into the skin See admin instructions. Three times a day if blood sugar is over 110 prior to meals   Yes [provider]  megestrol (MEGACE ORAL) 40 MG/ML suspension Take 400 mg by mouth 2 (two) times daily. 10 ML   Yes [provider]   No Known Allergies Review of Systems  Unable to perform ROS: Acuity of condition    Physical Exam  Constitutional: He appears well-developed and well-nourished.  HENT:  Head: Normocephalic and atraumatic.  Neck: Normal range of motion.  Cardiovascular: Normal rate.  Pulmonary/Chest: Effort normal.  Genitourinary:  Genitourinary Comments: Suprapubic catheter  Neurological: He is alert.  Oriented to self Short-term memory deficits noted Able to follow simple commands  Skin: Skin is warm and dry.  Psychiatric:  Mood depressed, affect constricted otherwise unable to test  Nursing note and vitals reviewed.   Vital Signs: BP (!) 109/46 (BP Location: Left Arm)   Pulse 79   Temp 97.6 F (36.4 C) (Oral)   Resp 20   Ht '5\' 9"'$  (1.753 m)    Wt 75.1 kg (165 lb 9.1 oz)   SpO2 99%   BMI 24.45 kg/m  Pain Scale: 0-10   Pain Score: 0-No pain   SpO2: SpO2: 99 % O2 Device:SpO2: 99 % O2 Flow Rate: .   IO: Intake/output summary:   Intake/Output Summary (Last 24 hours) at 03/21/2018 1617 Last data filed at 03/21/2018 1401 Gross per 24 hour  Intake 921.67 ml  Output 1300 ml  Net -378.33 ml    LBM: Last BM Date: (pt unsure) Baseline Weight: Weight: 75.1 kg (165 lb 9.1 oz) Most recent weight: Weight: 75.1 kg (165 lb 9.1 oz)     Palliative Assessment/Data:   Flowsheet Rows     Most Recent Value  Intake Tab  Referral Department  Hospitalist  Unit at Time of Referral  Med/Surg Unit  Palliative Care Primary Diagnosis  Sepsis/Infectious Disease  Date Notified  03/20/18  Palliative Care Type  New Palliative care  Reason for referral  Clarify Goals of Care  Date of Admission  03/20/18  Date first seen by Palliative Care  03/21/18  # of days Palliative referral response time  1 Day(s)  # of days IP prior to Palliative referral  0  Clinical Assessment  Palliative Performance Scale Score  40%  Pain Max last 24 hours  Not able to report  Pain Min Last 24 hours  Not able to report  Dyspnea Max Last 24 Hours  Not able to report  Dyspnea Min Last 24 hours  Not able to report  Nausea Max Last 24 Hours  Not able to report  Nausea Min Last 24 Hours  Not able to report  Anxiety Max Last 24 Hours  Not able to report  Anxiety Min Last 24 Hours  Not able to report  Other Max Last 24 Hours  Not able to report  Psychosocial & Spiritual Assessment  Palliative Care Outcomes  Patient/Family meeting held?  Yes  Who was at the meeting?  pt, pt's dtr Debora      Time In: 1500 Time Out: 1555 Time Total: 55 min  Greater than 50%  of this time was spent counseling and coordinating care related to the above assessment and plan.  Signed by: Dory Horn, NP   Please contact Palliative Medicine Team phone at [phone] for  questions and concerns.  For individual provider: See Shea Evans

## 2018-03-21 NOTE — Progress Notes (Signed)
Received verbal orders from Dr. Earlean Polka to have SCD as VTE until CT results are done. Also, check CBG Q 4. Orders were put in.   Eleanora Neighbor, RN

## 2018-03-21 NOTE — Progress Notes (Signed)
PROGRESS NOTE  Elijah Castro [id number] DOB: 1941/03/01 DOA: 03/20/2018 PCP: System, Pcp Not In  Brief Narrative: 77 year old man PMH pancreatic cancer diagnosed 01/2017, currently undergoing chemotherapy first dose May 8, diabetes mellitus, presented to the emergency department after he was found confused and wandering at his home.  Admitted for complicated UTI without evidence of sepsis, acute kidney injury, acute encephalopathy.  Assessment/Plan Complicated UTI, suprapubic catheter, with associated acute encephalopathy. --Afebrile, appears to be clinically improving, less confusion.  Continue empiric antibiotics but will narrow to cefepime. --Follow-up culture data  Acute encephalopathy secondary to UTI.  Apparently recently diagnosed with dementia as well.  CT head negative. --Seems to be improving.  Treatment as above.  Pancreatic cancer diagnosed 3/19, currently on chemotherapy.   --Follow-up as an outpatient  Pancytopenia secondary to chemotherapy. --Follow clinically.  Diabetes mellitus type 2 --CBG stable.  Anion gap within normal limits.  Continue sliding scale insulin.  Dementia per chart   DVT prophylaxis: SCDs Code Status: Full Family Communication: none Disposition Plan: home    Murray Hodgkins, MD  [facility] Direct contact: [phone] --Via [place]  --[email or web address]; password [email or web address]  7PM-7AM contact night coverage as above 03/21/2018, 11:47 AM  LOS: 1 day   Consultants:    Procedures:    Antimicrobials:  Cefepime 5/18 >>  Interval history/Subjective: Feels okay.  No pain.  No complaints today.  No nausea or vomiting.  Objective: Vitals:  Vitals:   03/21/18 0404 03/21/18 0930  BP: (!) 121/46 (!) 109/46  Pulse: 81 79  Resp: 17 20  Temp: 98.2 F (36.8 C) 97.6 F (36.4 C)  SpO2: 100% 99%    Exam:  Constitutional:  . Appears calm and comfortable Eyes:  . pupils and irises appear normal . Normal lids  ENMT:   . grossly normal hearing  . Lips appear normal Respiratory:  . CTA bilaterally, no w/r/r.  . Respiratory effort normal Cardiovascular:  . RRR, no m/r/g . No LE extremity edema   Abdomen:  . Soft, nontender.  Suprapubic catheter in place.  Site appears unremarkable. Musculoskeletal:  . RUE, LUE, RLE, LLE   o strength and tone normal, no atrophy, no abnormal movements Skin:  . No rashes, lesions, ulcers seen Psychiatric:  . Mental status o Mood, affect appropriate o Orientation to self, hospital, month, year . judgement and insight difficult to gauge  I have personally reviewed the following:   Labs:  Admission labs reviewed  Creatinine 1.47 >> 0.95  Lactic acid within normal limits.  Hemoglobin stable, 9.7  Urinalysis grossly abnormal  Imaging studies:  CT head no acute abnormalities.  Multiple chronic infarcts.  Renal ultrasound no acute abnormalities.  Chest x-ray independently reviewed: No acute disease.  Medical tests:  EKG sinus tachycardia   Scheduled Meds: . aspirin  81 mg Oral Daily  . atorvastatin  40 mg Oral QHS  . finasteride  5 mg Oral Daily  . insulin aspart  0-9 Units Subcutaneous TID WC  . metoprolol tartrate  12.5 mg Oral BID  . terazosin  2 mg Oral QPM   Continuous Infusions: . ceFEPime (MAXIPIME) IV      Principal Problem:   Complicated UTI (urinary tract infection) Active Problems:   Acute encephalopathy   Uncontrolled type 2 diabetes mellitus with hyperglycemia (HCC)   Pancreatic cancer ([place])   AKI (acute kidney injury) ([place])   Pancytopenia ([place])   Suprapubic catheter ([place])   LOS: 1 day

## 2018-03-21 NOTE — H&P (Signed)
History and Physical    Elijah Castro NKN:[id number] DOB: September 18, 1941 DOA: 03/20/2018  PCP: System, Pcp Not In  Patient coming from: Home.  Chief Complaint: Confusion.  HPI: Elijah Castro is a 77 y.o. male with history of hypertension, diabetes mellitus recently diagnosed pancreatic carcinoma being treated at the [facility] and has had received first chemotherapy on May 8 and is supposed to receive the second 1 but was held due to low blood count was found to be confused and wandering in his friend ER at his home noticed by his daughter.  Patient was brought to the ER.  Patient was admitted for confusion last month also.  Patient during last admission had to be placed on suprapubic catheter for urinary retention.  ED Course: In the ER patient was febrile with temperature of 100.7 F heart rate was 102 patient was confused elevated blood sugar with mildly elevated anion gap and acute renal failure.  Patient was given fluid bolus blood cultures urine cultures obtained chest x-ray unremarkable start on antibiotics for UTI and admitted for acute encephalopathy with SIRS likely from UTI.  CT head is pending.  The patient appears nonfocal.  Review of Systems: As per HPI, rest all negative.   Past Medical History:  Diagnosis Date  . BPH (benign prostatic hyperplasia)   . CHF (congestive heart failure) ([place])   . Coronary artery disease   . Diabetes mellitus without complication ([place])   . Hyperlipidemia   . Hypertension     Past Surgical History:  Procedure Laterality Date  . JOINT REPLACEMENT       reports that he has quit smoking. He has never used smokeless tobacco. He reports that he does not drink alcohol or use drugs.  No Known Allergies  Family History  Problem Relation Age of Onset  . Diabetes Mellitus II Daughter     Prior to Admission medications   Medication Sig Start Date End Date Taking? Authorizing Provider  acetaminophen (TYLENOL) 500 MG tablet Take 1,000 mg by  mouth as needed for mild pain.    [provider]  aspirin 81 MG chewable tablet Chew 81 mg by mouth daily.    [provider]  atorvastatin (LIPITOR) 80 MG tablet Take 40 mg by mouth at bedtime.    [provider]  finasteride (PROSCAR) 5 MG tablet Take 5 mg by mouth daily.    [provider]  insulin aspart (NOVOLOG) 100 UNIT/ML injection Inject 5 Units into the skin See admin instructions. Three times a day if blood sugar is over 110 prior to meals    [provider]  lisinopril (PRINIVIL,ZESTRIL) 5 MG tablet Take 1 tablet (5 mg total) by mouth daily. 02/19/18 02/19/19  Ledell Noss, MD  metFORMIN (GLUCOPHAGE) 500 MG tablet Take 1 tablet (500 mg total) by mouth 2 (two) times daily with a meal. 02/19/18   Ledell Noss, MD  metoprolol tartrate (LOPRESSOR) 25 MG tablet Take 12.5 mg by mouth 2 (two) times daily.    [provider]  ondansetron (ZOFRAN-ODT) 4 MG disintegrating tablet Take 4 mg by mouth every 6 (six) hours as needed for nausea/vomiting. 01/27/18   [provider]  sildenafil (VIAGRA) 100 MG tablet Take 100 mg by mouth daily as needed for erectile dysfunction.    [provider]  terazosin (HYTRIN) 2 MG capsule Take 2 mg by mouth every evening.    [provider]    Physical Exam: Vitals:   03/20/18 2145 03/20/18 2200 03/20/18  2215 03/20/18 2300  BP: 116/62 (!) 120/59 117/66 (!) 153/63  Pulse: 88 91 87   Resp: 20 12 17    Temp:      TempSrc:      SpO2: 100% 100% 100%   Weight:      Height:          Constitutional: Moderately built and nourished. Vitals:   03/20/18 2145 03/20/18 2200 03/20/18 2215 03/20/18 2300  BP: 116/62 (!) 120/59 117/66 (!) 153/63  Pulse: 88 91 87   Resp: 20 12 17    Temp:      TempSrc:      SpO2: 100% 100% 100%   Weight:      Height:       Eyes: Anicteric no pallor. ENMT: No discharge from the ears eyes nose or mouth. Neck: No mass felt.  No neck rigidity. Respiratory:  No rhonchi or crepitations. Cardiovascular: S1-S2 heard no murmurs appreciated. Abdomen: Soft nontender bowel sounds present. Musculoskeletal: No edema.  No joint effusion. Skin: No rash.  Skin appears warm. Neurologic: Alert awake oriented to his name otherwise confused moves all extremities.  Pupils are equal and reacting to light. Psychiatric: Appears confused.   Labs on Admission: I have personally reviewed following labs and imaging studies  CBC: Recent Labs  Lab 03/20/18 1954  WBC 4.2  NEUTROABS 2.7  HGB 10.6*  HCT 32.2*  MCV 89.4  PLT 712   Basic Metabolic Panel: Recent Labs  Lab 03/20/18 1954  NA 133*  K 5.1  CL 100*  CO2 17*  GLUCOSE 355*  BUN 21*  CREATININE 1.47*  CALCIUM 9.3   GFR: Estimated Creatinine Clearance: 43.4 mL/min (A) (by C-G formula based on SCr of 1.47 mg/dL (H)). Liver Function Tests: Recent Labs  Lab 03/20/18 1954  AST 18  ALT 12*  ALKPHOS 51  BILITOT 1.5*  PROT 7.3  ALBUMIN 3.6   No results for input(s): LIPASE, AMYLASE in the last 168 hours. No results for input(s): AMMONIA in the last 168 hours. Coagulation Profile: No results for input(s): INR, PROTIME in the last 168 hours. Cardiac Enzymes: No results for input(s): CKTOTAL, CKMB, CKMBINDEX, TROPONINI in the last 168 hours. BNP (last 3 results) No results for input(s): PROBNP in the last 8760 hours. HbA1C: No results for input(s): HGBA1C in the last 72 hours. CBG: No results for input(s): GLUCAP in the last 168 hours. Lipid Profile: No results for input(s): CHOL, HDL, LDLCALC, TRIG, CHOLHDL, LDLDIRECT in the last 72 hours. Thyroid Function Tests: No results for input(s): TSH, T4TOTAL, FREET4, T3FREE, THYROIDAB in the last 72 hours. Anemia Panel: No results for input(s): VITAMINB12, FOLATE, FERRITIN, TIBC, IRON, RETICCTPCT in the last 72 hours. Urine analysis:    Component Value Date/Time   COLORURINE AMBER (A) 03/20/2018 2045   APPEARANCEUR CLOUDY (A) 03/20/2018 2045    LABSPEC 1.023 03/20/2018 2045   PHURINE 5.0 03/20/2018 2045   GLUCOSEU >=500 (A) 03/20/2018 2045   HGBUR MODERATE (A) 03/20/2018 2045   BILIRUBINUR NEGATIVE 03/20/2018 2045   KETONESUR 80 (A) 03/20/2018 2045   PROTEINUR 100 (A) 03/20/2018 2045   NITRITE NEGATIVE 03/20/2018 2045   LEUKOCYTESUR LARGE (A) 03/20/2018 2045   Sepsis Labs: @LABRCNTIP (procalcitonin:4,lacticidven:4) )No results found for this or any previous visit (from the past 240 hour(s)).   Radiological Exams on Admission: Dg Chest Port 1 View  Result Date: 03/20/2018 CLINICAL DATA:  Altered mental status, sepsis. History of pancreatic cancer, on chemotherapy. EXAM: PORTABLE CHEST 1 VIEW COMPARISON:  Chest radiograph  February 15, 2018 FINDINGS: Cardiomediastinal silhouette is normal. No pleural effusions or focal consolidations. Trachea projects midline and there is no pneumothorax. Soft tissue planes and included osseous structures are non-suspicious. Single lumen RIGHT chest Port-A-Cath distal tip projects in distal superior vena cava. IMPRESSION: No acute cardiopulmonary process. Electronically Signed   By: Elon Alas M.D.   On: 03/20/2018 20:33    Assessment/Plan Principal Problem:   SIRS (systemic inflammatory response syndrome) (HCC) Active Problems:   Acute encephalopathy   Uncontrolled type 2 diabetes mellitus with hyperglycemia (HCC)   ARF (acute renal failure) (HCC)   Normochromic normocytic anemia   Hypertension   Pancreatic cancer ([place])    1. SIRS with likely source being urinary tract infection and patient has suprapubic catheter.  Patient placed on empiric antibiotics for now until culture results are available.  Continue with gentle hydration. 2. Acute renal failure likely from poor oral intake and dehydration.  Check a renal sonogram to make sure there is no obstruction.  Hold ACE inhibitor due to acute renal failure. 3. Acute encephalopathy likely from fever UTI and patient also has a recent  diagnosis of dementia.  CT head is pending. 4. Diabetes mellitus type 2 uncontrolled with elevated anion gap.  I am repeating a metabolic panel and if still shows elevated anion gap may start insulin infusion otherwise will keep patient on sliding scale coverage hold metformin while inpatient. 5. Normocytic normochromic anemia -this to be chronic follow CBC.  Hemoglobin comparable to April 2019. 6. Thrombocytopenia appears to be new.  May be related to chemotherapy.  Follow CBC closely.  If there is any further worsening look for hemolytic process. 7. Hypertension -since patient has acute renal failure holding of lisinopril will continue metoprolol and keep patient on PRN IV hydralazine. 8. The pancreatic adenocarcinoma been followed at [facility] has received chemotherapy on May 8 as per the patient's daughter.   DVT prophylaxis: Lovenox if CT head is negative. Code Status: Full code confirmed with patient's daughter. Family Communication: Patient's daughter who provided the history. Disposition Plan: Home.  Patient lives with the daughter. Consults called: None. Admission status: Inpatient.   Rise Patience MD [facility] Pager [phone].  If 7PM-7AM, please contact night-coverage [email or web address] Password [email or web address]  03/21/2018, 1:32 AM

## 2018-03-22 ENCOUNTER — Other Ambulatory Visit: Payer: Self-pay

## 2018-03-22 DIAGNOSIS — F329 Major depressive disorder, single episode, unspecified: Secondary | ICD-10-CM

## 2018-03-22 DIAGNOSIS — Z87891 Personal history of nicotine dependence: Secondary | ICD-10-CM

## 2018-03-22 DIAGNOSIS — E119 Type 2 diabetes mellitus without complications: Secondary | ICD-10-CM

## 2018-03-22 DIAGNOSIS — Z9183 Wandering in diseases classified elsewhere: Secondary | ICD-10-CM

## 2018-03-22 DIAGNOSIS — F0391 Unspecified dementia with behavioral disturbance: Secondary | ICD-10-CM

## 2018-03-22 LAB — GLUCOSE, CAPILLARY
GLUCOSE-CAPILLARY: 235 mg/dL — AB (ref 65–99)
GLUCOSE-CAPILLARY: 209 mg/dL — AB (ref 65–99)
GLUCOSE-CAPILLARY: 204 mg/dL — AB (ref 65–99)
Glucose-Capillary: 146 mg/dL — ABNORMAL HIGH (ref 65–99)
Glucose-Capillary: 172 mg/dL — ABNORMAL HIGH (ref 65–99)

## 2018-03-22 MED ORDER — MEGESTROL ACETATE 400 MG/10ML PO SUSP
400.0000 mg | Freq: Two times a day (BID) | ORAL | Status: DC
  Administered 2018-03-22 – 2018-03-23 (×3): 400 mg via ORAL
  Filled 2018-03-22 (×4): qty 10

## 2018-03-22 MED ORDER — SODIUM CHLORIDE 0.9% FLUSH: 10.0000 mL | Freq: Two times a day (BID) | INTRAVENOUS | Status: DC

## 2018-03-22 MED ORDER — MEGESTROL ACETATE 40 MG/ML PO SUSP
400.0000 mg | Freq: Two times a day (BID) | ORAL | Status: DC
  Filled 2018-03-22: qty 10

## 2018-03-22 NOTE — Progress Notes (Signed)
  PROGRESS NOTE  Elijah Castro VQM:[id number] DOB: [birth year] DOA: 03/20/2018 PCP: System, Pcp Not In  Brief Narrative: 77 year old man PMH pancreatic cancer diagnosed 01/2017, currently undergoing chemotherapy first dose May 8, diabetes mellitus, presented to the emergency department after he was found confused and wandering at his home.  Admitted for complicated UTI without evidence of sepsis, acute kidney injury, acute encephalopathy.  Assessment/Plan Complicated UTI, Enterococcus faecalis and Acinetobacter; suprapubic catheter, with associated acute encephalopathy. --Remains afebrile with stable vital signs.  Will continue empiric therapy with cefepime and await sensitivities given clinical response.  Acute encephalopathy secondary to UTI.  Apparently recently diagnosed with dementia as well.  CT head negative. --Appears stable.  May be close to baseline.  Pancreatic cancer diagnosed 3/19, currently on chemotherapy.   --Follow-up as an outpatient  Pancytopenia secondary to chemotherapy. --Follow clinically.  Diabetes mellitus type 2 --CBG remains stable.  Continue sliding scale insulin sliding scale insulin.   Dementia per chart, depression.  DVT prophylaxis: SCDs Code Status: Full Family Communication: none Disposition Plan: home    Murray Hodgkins, MD  [facility] Direct contact: [phone] --Via amion app OR  --[email or web address]; password [email or web address]  7PM-7AM contact night coverage as above 03/22/2018, 10:55 AM  LOS: 2 days   Consultants:    Procedures:    Antimicrobials:  Cefepime 5/18 >>  Interval history/Subjective: Trying to get some sleep right now.  Feels okay.  Confused.  Objective: Vitals:  Vitals:   03/22/18 0521 03/22/18 0926  BP: 132/83 (!) 127/57  Pulse: 84 79  Resp: 14 20  Temp: (!) 97.5 F (36.4 C) 97.6 F (36.4 C)  SpO2: 100% 100%    Exam: Constitutional:   . Appears calm and comfortable Respiratory:  . CTA bilaterally, no w/r/r.    . Respiratory effort normal.  Cardiovascular:  . RRR, no m/r/g . No LE extremity edema   Abdomen:  . , Nontender, nondistended.  Suprapubic catheter in place. Psychiatric:  . Mental status o Mood, affect appropriate o Oriented to person, generic hospital, not month or year  I have personally reviewed the following:   Labs:  CBG stable  Urine culture 40,000 colonies Enterococcus faecalis and greater than 100,000 colonies Acinetobacter  Scheduled Meds: . aspirin  81 mg Oral Daily  . atorvastatin  40 mg Oral QHS  . finasteride  5 mg Oral Daily  . insulin aspart  0-9 Units Subcutaneous TID WC  . metoprolol tartrate  12.5 mg Oral BID  . terazosin  2 mg Oral QPM   Continuous Infusions: . ceFEPime (MAXIPIME) IV Stopped (03/21/18 2100)    Principal Problem:   Complicated UTI (urinary tract infection) Active Problems:   Acute encephalopathy   Uncontrolled type 2 diabetes mellitus with hyperglycemia (HCC)   Pancreatic cancer (HCC)   AKI (acute kidney injury) ([place])   Pancytopenia ([place])   Suprapubic catheter ([place])   Goals of care, counseling/discussion   Palliative care by specialist   LOS: 2 days

## 2018-03-22 NOTE — Progress Notes (Signed)
At shift change last night, it was noted that the urostomy bag was leaking at the bottom.  Attempted to order new urostomy bag from supplies, but unable to locate correct bag.  Rapid Response RN went to OR and they were also unable to locate correct bag.  Called and spoke with patient's daughter, Cristino Martes, this morning.  She will have her sister bring another urostomy bag at some point this morning.  Will continue to monitor patient.  Earleen Reaper RN-BC, [employer]

## 2018-03-22 NOTE — Evaluation (Signed)
Physical Therapy Evaluation Patient Details Name: Elijah Castro MRN: [id number] DOB: Jan 19, 1941 Today's Date: 03/22/2018   History of Present Illness  77 year old man PMH pancreatic cancer diagnosed 01/2017, currently undergoing chemotherapy first dose May 8, diabetes mellitus, presented to the emergency department after he was found confused and wandering at his home.  Admitted for complicated UTI without evidence of sepsis, acute kidney injury, acute encephalopathy.  Clinical Impression  Patient presents with dependencies in gait and mobility due to generalized weakness and decreased balance, further complicated by decreased cognition.  Feel patient is fairly close to his baseline from home.  Patient will benefit from continued PT to progress mobility and gait and independence.      Follow Up Recommendations Home health PT;Supervision for mobility/OOB    Equipment Recommendations  None recommended by PT    Recommendations for Other Services       Precautions / Restrictions Precautions Precautions: Fall Restrictions Weight Bearing Restrictions: No      Mobility  Bed Mobility Overal bed mobility: Needs Assistance Bed Mobility: Supine to Sit     Supine to sit: Min assist        Transfers Overall transfer level: Needs assistance Equipment used: Rolling walker (2 wheeled) Transfers: Sit to/from Stand Sit to Stand: Min assist         General transfer comment: assist for balance  Ambulation/Gait Ambulation/Gait assistance: Min assist Ambulation Distance (Feet): 50 Feet Assistive device: Rolling walker (2 wheeled) Gait Pattern/deviations: Step-through pattern;Decreased stride length;Scissoring Gait velocity: decreased Gait velocity interpretation: <1.8 ft/sec, indicate of risk for recurrent falls General Gait Details: verbal cueing for wider base of support; patient required assistance for balance  Stairs            Wheelchair Mobility    Modified Rankin  (Stroke Patients Only)       Balance   Sitting-balance support: No upper extremity supported;Feet supported Sitting balance-Leahy Scale: Good     Standing balance support: Bilateral upper extremity supported;During functional activity Standing balance-Leahy Scale: Poor Standing balance comment: reliant on RW and assistance for balance                             Pertinent Vitals/Pain Pain Assessment: No/denies pain    Home Living Family/patient expects to be discharged to:: Private residence Living Arrangements: Children Available Help at Discharge: Family;Available PRN/intermittently Type of Home: House Home Access: Stairs to enter Entrance Stairs-Rails: Right Entrance Stairs-Number of Steps: 3 Home Layout: One level Home Equipment: Cane - single point;Walker - 4 wheels;Shower seat Additional Comments: Per last admission:  Patient reports he is by himself during the day. He reports going between his two daughter's house in [place] and [place]. Patient's house in [place] has level entry but the home in [place] has 3 steps to enter with R railing.    Prior Function Level of Independence: Needs assistance   Gait / Transfers Assistance Needed: Uses Rollator  ADL's / Homemaking Assistance Needed: Daughters help with IADL's including housework, cooking, and paying bills. Patient daughter also states she occasionally helps with dressing and bathing.        Hand Dominance        Extremity/Trunk Assessment   Upper Extremity Assessment Upper Extremity Assessment: Generalized weakness    Lower Extremity Assessment Lower Extremity Assessment: Generalized weakness    Cervical / Trunk Assessment Cervical / Trunk Assessment: Normal  Communication   Communication: No difficulties  Cognition Arousal/Alertness: Awake/alert Behavior During  Therapy: WFL for tasks assessed/performed Overall Cognitive Status: History of cognitive impairments - at baseline                                  General Comments: patient taking about walking around the lobby of the hospital; pleasant but confused      General Comments      Exercises     Assessment/Plan    PT Assessment Patient needs continued PT services  PT Problem List Decreased strength;Decreased activity tolerance;Decreased balance;Decreased mobility;Decreased cognition;Decreased safety awareness       PT Treatment Interventions DME instruction;Gait training;Stair training;Functional mobility training;Therapeutic activities;Therapeutic exercise;Balance training;Patient/family education    PT Goals (Current goals can be found in the Care Plan section)  Acute Rehab PT Goals Patient Stated Goal: go home PT Goal Formulation: With patient/family Time For Goal Achievement: 04/05/18 Potential to Achieve Goals: Good    Frequency Min 2X/week   Barriers to discharge Decreased caregiver support      Co-evaluation               AM-PAC PT "6 Clicks" Daily Activity  Outcome Measure Difficulty turning over in bed (including adjusting bedclothes, sheets and blankets)?: A Little Difficulty moving from lying on back to sitting on the side of the bed? : A Little Difficulty sitting down on and standing up from a chair with arms (e.g., wheelchair, bedside commode, etc,.)?: Unable Help needed moving to and from a bed to chair (including a wheelchair)?: A Little Help needed walking in hospital room?: A Little Help needed climbing 3-5 steps with a railing? : A Little 6 Click Score: 16    End of Session Equipment Utilized During Treatment: Gait belt Activity Tolerance: Patient tolerated treatment well Patient left: in chair;with chair alarm set;with call bell/phone within reach;with family/visitor present   PT Visit Diagnosis: Unsteadiness on feet (R26.81);Other abnormalities of gait and mobility (R26.89);Muscle weakness (generalized) (M62.81);History of falling (Z91.81)     Time: 1225-1250 PT Time Calculation (min) (ACUTE ONLY): 25 min   Charges:   PT Evaluation $PT Eval Moderate Complexity: 1 Mod PT Treatments $Gait Training: 8-22 mins   PT G Codes:        2018-04-13 [facility], PT [phone]    Shanna Cisco Apr 13, 2018, 1:25 PM

## 2018-03-22 NOTE — Consult Note (Signed)
[facility] Consult   Reason for Consult:  Dementia and depression Referring Physician:  Dr. Sarajane Jews Patient Identification: Elijah Castro MRN:  [id number] Principal Diagnosis: Complicated UTI (urinary tract infection) Diagnosis:   Patient Active Problem List   Diagnosis Date Noted  . Acute encephalopathy [G93.40] 03/21/2018  . Uncontrolled type 2 diabetes mellitus with hyperglycemia ([place]) [E11.65] 03/21/2018  . Hypertension [I10] 03/21/2018  . Pancreatic cancer ([place]) [C25.9] 03/21/2018  . Complicated UTI (urinary tract infection) [N39.0] 03/21/2018  . AKI (acute kidney injury) ([place]) [N17.9] 03/21/2018  . Pancytopenia ([place]) [V78.588] 03/21/2018  . Suprapubic catheter ([place]) [Z93.59] 03/21/2018  . Goals of care, counseling/discussion [Z71.89]   . Palliative care by specialist [Z51.5]   . Urinary retention [R33.9] 02/17/2018  . Altered mental status [R41.82] 02/15/2018  . Hyperglycemia [R73.9] 05/07/2016    Total Time spent with patient: 45 minutes  Subjective:   Elijah Castro is a 77 y.o. male patient admitted with confused and wandering. Marland Kitchen  HPI: As per history: Elijah Castro is a 77 y.o. male with history of hypertension, diabetes mellitus recently diagnosed pancreatic carcinoma being treated at the [facility] and has had received first chemotherapy on May 8 and is supposed to receive the second 1 but was held due to low blood count was found to be confused and wandering in his friend ER at his home noticed by his daughter.  Patient was brought to the ER.  Patient was admitted for confusion last month also.  Patient during last admission had to be placed on suprapubic catheter for urinary retention.  ED Course: In the ER patient was febrile with temperature of 100.7 F heart rate was 102 patient was confused elevated blood sugar with mildly elevated anion gap and acute renal failure.  Patient was given fluid bolus blood cultures urine cultures obtained chest x-ray  unremarkable start on antibiotics for UTI and admitted for acute encephalopathy with SIRS likely from UTI.   As per evaluation today: Patient appeared sitting in a chair next to his bed and his son. Patient stated he has no symptoms of depression, anxiety, PTSD, bipolar disorder and schizophrenia.  Patient denied auditory/visual hallucinations, delusions or paranoia.  Patient reported he has no appetite but having no problem drinking water.  Patient also stated he does not feel eating and the food was given to him and also found he has acute encephalopathy with the SIRS likely symptoms from the UTI on admission.  Patient has been receiving antibiotics which has been helping him to improve his symptoms of UTI.  Patient son reported patient has been staying with his sister in [place] and then he has a plans to go back and stay with his sister Otila Kluver in [place] after the hospitalization.  Patient's son also endorses that the patient has no history of mental illness or substance abuse.  Patient has no safety concerns during this hospitalization.    Past Psychiatric History: None reported  Risk to Self: Is patient at risk for suicide?: No Risk to Others:   Prior Inpatient Therapy:   Prior Outpatient Therapy:    Past Medical History:  Past Medical History:  Diagnosis Date  . BPH (benign prostatic hyperplasia)   . CHF (congestive heart failure) ([place])   . Coronary artery disease   . Diabetes mellitus without complication ([place])   . Hyperlipidemia   . Hypertension     Past Surgical History:  Procedure Laterality Date  . JOINT REPLACEMENT     Family History:  Family History  Problem Relation Age of Onset  . Diabetes Mellitus II Daughter    Family Psychiatric  History: No family history of mental illness Social History:  Social History   Substance and Sexual Activity  Alcohol Use No     Social History   Substance and Sexual Activity  Drug Use No    Social History   Socioeconomic  History  . Marital status: Divorced    Spouse name: Not on file  . Number of children: Not on file  . Years of education: Not on file  . Highest education level: Not on file  Occupational History  . Not on file  Social Needs  . Financial resource strain: Not on file  . Food insecurity:    Worry: Not on file    Inability: Not on file  . Transportation needs:    Medical: Not on file    Non-medical: Not on file  Tobacco Use  . Smoking status: Former Research scientist (life sciences)  . Smokeless tobacco: Never Used  Substance and Sexual Activity  . Alcohol use: No  . Drug use: No  . Sexual activity: Not on file  Lifestyle  . Physical activity:    Days per week: Not on file    Minutes per session: Not on file  . Stress: Not on file  Relationships  . Social connections:    Talks on phone: Not on file    Gets together: Not on file    Attends religious service: Not on file    Active member of club or organization: Not on file    Attends meetings of clubs or organizations: Not on file    Relationship status: Not on file  Other Topics Concern  . Not on file  Social History Narrative  . Not on file   Additional Social History:    Allergies:  No Known Allergies  Labs:  Results for orders placed or performed during the hospital encounter of 03/20/18 (from the past 48 hour(s))  Comprehensive metabolic panel     Status: Abnormal   Collection Time: 03/20/18  7:54 PM  Result Value Ref Range   Sodium 133 (L) 135 - 145 mmol/L   Potassium 5.1 3.5 - 5.1 mmol/L   Chloride 100 (L) 101 - 111 mmol/L   CO2 17 (L) 22 - 32 mmol/L   Glucose, Bld 355 (H) 65 - 99 mg/dL   BUN 21 (H) 6 - 20 mg/dL   Creatinine, Ser 1.47 (H) 0.61 - 1.24 mg/dL   Calcium 9.3 8.9 - 10.3 mg/dL   Total Protein 7.3 6.5 - 8.1 g/dL   Albumin 3.6 3.5 - 5.0 g/dL   AST 18 15 - 41 U/L   ALT 12 (L) 17 - 63 U/L   Alkaline Phosphatase 51 38 - 126 U/L   Total Bilirubin 1.5 (H) 0.3 - 1.2 mg/dL   GFR calc non Af Amer 45 (L) >60 mL/min   GFR calc  Af Amer 52 (L) >60 mL/min    Comment: (NOTE) The eGFR has been calculated using the CKD EPI equation. This calculation has not been validated in all clinical situations. eGFR's persistently <60 mL/min signify possible Chronic Kidney Disease.    Anion gap 16 (H) 5 - 15    Comment: Performed at [facility], [place] [address] [place], [place] [address]  CBC WITH DIFFERENTIAL     Status: Abnormal   Collection Time: 03/20/18  7:54 PM  Result Value Ref Range   WBC 4.2 4.0 - 10.5  K/uL   RBC 3.60 (L) 4.22 - 5.81 MIL/uL   Hemoglobin 10.6 (L) 13.0 - 17.0 g/dL   HCT 32.2 (L) 39.0 - 52.0 %   MCV 89.4 78.0 - 100.0 fL   MCH 29.4 26.0 - 34.0 pg   MCHC 32.9 30.0 - 36.0 g/dL   RDW 14.5 11.5 - 15.5 %   Platelets 150 150 - 400 K/uL   Neutrophils Relative % 63 %   Neutro Abs 2.7 1.7 - 7.7 K/uL   Lymphocytes Relative 28 %   Lymphs Abs 1.2 0.7 - 4.0 K/uL   Monocytes Relative 7 %   Monocytes Absolute 0.3 0.1 - 1.0 K/uL   Eosinophils Relative 1 %   Eosinophils Absolute 0.0 0.0 - 0.7 K/uL   Basophils Relative 1 %   Basophils Absolute 0.0 0.0 - 0.1 K/uL   Immature Granulocytes 0 %   Abs Immature Granulocytes 0.0 0.0 - 0.1 K/uL    Comment: Performed at [facility] [address], [place], [place] [address]  Blood Culture (routine x 2)     Status: None (Preliminary result)   Collection Time: 03/20/18  8:00 PM  Result Value Ref Range   Specimen Description BLOOD RIGHT ANTECUBITAL    Special Requests      BOTTLES DRAWN AEROBIC AND ANAEROBIC Blood Culture adequate volume   Culture      NO GROWTH 2 DAYS Performed at [facility], [place] [address] [place], [place] [address]    Report Status PENDING   POC occult blood, ED     Status: None   Collection Time: 03/20/18  8:13 PM  Result Value Ref Range   Fecal Occult Bld NEGATIVE NEGATIVE  Blood Culture (routine x 2)     Status: None (Preliminary result)   Collection Time: 03/20/18  8:24 PM  Result Value Ref Range   Specimen  Description BLOOD BLOOD RIGHT FOREARM    Special Requests      BOTTLES DRAWN AEROBIC AND ANAEROBIC Blood Culture adequate volume   Culture      NO GROWTH 2 DAYS Performed at [facility], [place] [address] [place], [place] [address]    Report Status PENDING   I-Stat CG4 Lactic Acid, ED  (not at  [facility])     Status: None   Collection Time: 03/20/18  8:44 PM  Result Value Ref Range   Lactic Acid, Venous 1.75 0.5 - 1.9 mmol/L  Urinalysis, Routine w reflex microscopic     Status: Abnormal   Collection Time: 03/20/18  8:45 PM  Result Value Ref Range   Color, Urine AMBER (A) YELLOW    Comment: BIOCHEMICALS MAY BE AFFECTED BY COLOR   APPearance CLOUDY (A) CLEAR   Specific Gravity, Urine 1.023 1.005 - 1.030   pH 5.0 5.0 - 8.0   Glucose, UA >=500 (A) NEGATIVE mg/dL   Hgb urine dipstick MODERATE (A) NEGATIVE   Bilirubin Urine NEGATIVE NEGATIVE   Ketones, ur 80 (A) NEGATIVE mg/dL   Protein, ur 100 (A) NEGATIVE mg/dL   Nitrite NEGATIVE NEGATIVE   Leukocytes, UA LARGE (A) NEGATIVE   RBC / HPF 21-50 0 - 5 RBC/hpf   WBC, UA >50 (H) 0 - 5 WBC/hpf   Bacteria, UA FEW (A) NONE SEEN   Squamous Epithelial / LPF 0-5 0 - 5   Mucus PRESENT    Hyaline Casts, UA PRESENT     Comment: Performed at [facility], [address] [place], [place]  [address]  Urine culture     Status: Abnormal (Preliminary result)   Collection Time: 03/20/18  8:45 PM  Result Value Ref Range   Specimen Description URINE, SUPRAPUBIC    Special Requests NONE    Culture (A)     40,000 COLONIES/mL ENTEROCOCCUS FAECALIS >=100,000 COLONIES/mL ACINETOBACTER CALCOACETICUS/BAUMANNII COMPLEX SUSCEPTIBILITIES TO FOLLOW Performed at [facility], [place] [address], [place], [place] [address]    Report Status PENDING   I-Stat arterial blood gas, ED     Status: Abnormal   Collection Time: 03/20/18  9:42 PM  Result Value Ref Range   pH, Arterial 7.415 7.350 - 7.450   pCO2 arterial 28.5 (L) 32.0 - 48.0 mmHg   pO2,  Arterial 105.0 83.0 - 108.0 mmHg   Bicarbonate 18.4 (L) 20.0 - 28.0 mmol/L   TCO2 19 (L) 22 - 32 mmol/L   O2 Saturation 98.0 %   Acid-base deficit 5.0 (H) 0.0 - 2.0 mmol/L   Patient temperature 98.0 F    Collection site RADIAL, ALLEN'S TEST ACCEPTABLE    Drawn by Operator    Sample type ARTERIAL   CBC with Differential     Status: Abnormal   Collection Time: 03/21/18  1:29 AM  Result Value Ref Range   WBC 3.8 (L) 4.0 - 10.5 K/uL   RBC 3.27 (L) 4.22 - 5.81 MIL/uL   Hemoglobin 9.7 (L) 13.0 - 17.0 g/dL   HCT 29.1 (L) 39.0 - 52.0 %   MCV 89.0 78.0 - 100.0 fL   MCH 29.7 26.0 - 34.0 pg   MCHC 33.3 30.0 - 36.0 g/dL   RDW 14.5 11.5 - 15.5 %   Platelets 135 (L) 150 - 400 K/uL   Neutrophils Relative % 56 %   Neutro Abs 2.1 1.7 - 7.7 K/uL   Lymphocytes Relative 34 %   Lymphs Abs 1.3 0.7 - 4.0 K/uL   Monocytes Relative 8 %   Monocytes Absolute 0.3 0.1 - 1.0 K/uL   Eosinophils Relative 1 %   Eosinophils Absolute 0.1 0.0 - 0.7 K/uL   Basophils Relative 0 %   Basophils Absolute 0.0 0.0 - 0.1 K/uL   Immature Granulocytes 1 %   Abs Immature Granulocytes 0.0 0.0 - 0.1 K/uL    Comment: Performed at [facility], [address], [place], [place] [address]  Procalcitonin     Status: None   Collection Time: 03/21/18  1:29 AM  Result Value Ref Range   Procalcitonin <0.10 ng/mL    Comment:        Interpretation: PCT (Procalcitonin) <= 0.5 ng/mL: Systemic infection (sepsis) is not likely. Local bacterial infection is possible. (NOTE)       Sepsis PCT Algorithm           Lower Respiratory Tract                                      Infection PCT Algorithm    ----------------------------     ----------------------------         PCT < 0.25 ng/mL                PCT < 0.10 ng/mL         Strongly encourage             Strongly discourage   discontinuation of antibiotics    initiation of antibiotics    ----------------------------     -----------------------------  PCT 0.25 - 0.50 ng/mL             PCT 0.10 - 0.25 ng/mL               OR       >80% decrease in PCT            Discourage initiation of                                            antibiotics      Encourage discontinuation           of antibiotics    ----------------------------     -----------------------------         PCT >= 0.50 ng/mL              PCT 0.26 - 0.50 ng/mL               AND        <80% decrease in PCT             Encourage initiation of                                             antibiotics       Encourage continuation           of antibiotics    ----------------------------     -----------------------------        PCT >= 0.50 ng/mL                  PCT > 0.50 ng/mL               AND         increase in PCT                  Strongly encourage                                      initiation of antibiotics    Strongly encourage escalation           of antibiotics                                     -----------------------------                                           PCT <= 0.25 ng/mL                                                 OR                                        > 80% decrease in PCT  Discontinue / Do not initiate                                             antibiotics Performed at [facility], [place] [address], [place], [place] [address]   Protime-INR     Status: Abnormal   Collection Time: 03/21/18  1:29 AM  Result Value Ref Range   Prothrombin Time 16.0 (H) 11.4 - 15.2 seconds   INR 1.29     Comment: Performed at [facility] [address] [place], [place] [address]  APTT     Status: Abnormal   Collection Time: 03/21/18  1:29 AM  Result Value Ref Range   aPTT 62 (H) 24 - 36 seconds    Comment:        IF BASELINE aPTT IS ELEVATED, SUGGEST PATIENT RISK ASSESSMENT BE USED TO DETERMINE APPROPRIATE ANTICOAGULANT THERAPY. Performed at [facility], [place] [address], [place], [place] [address]   Basic metabolic panel      Status: Abnormal   Collection Time: 03/21/18  1:29 AM  Result Value Ref Range   Sodium 136 135 - 145 mmol/L   Potassium 4.0 3.5 - 5.1 mmol/L    Comment: DELTA CHECK NOTED   Chloride 104 101 - 111 mmol/L   CO2 21 (L) 22 - 32 mmol/L   Glucose, Bld 268 (H) 65 - 99 mg/dL   BUN 16 6 - 20 mg/dL   Creatinine, Ser 0.95 0.61 - 1.24 mg/dL   Calcium 8.5 (L) 8.9 - 10.3 mg/dL   GFR calc non Af Amer >60 >60 mL/min   GFR calc Af Amer >60 >60 mL/min    Comment: (NOTE) The eGFR has been calculated using the CKD EPI equation. This calculation has not been validated in all clinical situations. eGFR's persistently <60 mL/min signify possible Chronic Kidney Disease.    Anion gap 11 5 - 15    Comment: Performed at [facility] [address] [place], [place] [address]  Hepatic function panel     Status: Abnormal   Collection Time: 03/21/18  1:29 AM  Result Value Ref Range   Total Protein 6.3 (L) 6.5 - 8.1 g/dL   Albumin 3.1 (L) 3.5 - 5.0 g/dL   AST 14 (L) 15 - 41 U/L   ALT 11 (L) 17 - 63 U/L   Alkaline Phosphatase 42 38 - 126 U/L   Total Bilirubin 1.3 (H) 0.3 - 1.2 mg/dL   Bilirubin, Direct 0.2 0.1 - 0.5 mg/dL   Indirect Bilirubin 1.1 (H) 0.3 - 0.9 mg/dL    Comment: Performed at [facility] [address] [place], [place] [address]  Lactic acid, plasma     Status: None   Collection Time: 03/21/18  2:54 AM  Result Value Ref Range   Lactic Acid, Venous 0.7 0.5 - 1.9 mmol/L    Comment: Performed at [facility] [address] [place], [place] [address]  Glucose, capillary     Status: Abnormal   Collection Time: 03/21/18  4:02 AM  Result Value Ref Range   Glucose-Capillary 229 (H) 65 - 99 mg/dL  Type and screen [place]     Status: None   Collection Time: 03/21/18  5:00 AM  Result Value Ref Range   ABO/RH(D) A POS    Antibody Screen  NEG    Sample Expiration      03/24/2018 Performed at [facility], [place] [address], [place], [place]  [address]   ABO/Rh     Status: None   Collection Time: 03/21/18  5:00 AM  Result Value Ref Range   ABO/RH(D)      A POS Performed at [facility] [address] [place], [place] [address]   Lactic acid, plasma     Status: None   Collection Time: 03/21/18  7:12 AM  Result Value Ref Range   Lactic Acid, Venous 1.4 0.5 - 1.9 mmol/L    Comment: Performed at [facility] [address] [place], [place] [address]  Glucose, capillary     Status: Abnormal   Collection Time: 03/21/18  7:29 AM  Result Value Ref Range   Glucose-Capillary 160 (H) 65 - 99 mg/dL  Glucose, capillary     Status: Abnormal   Collection Time: 03/21/18 11:37 AM  Result Value Ref Range   Glucose-Capillary 177 (H) 65 - 99 mg/dL  Glucose, capillary     Status: Abnormal   Collection Time: 03/21/18  4:34 PM  Result Value Ref Range   Glucose-Capillary 209 (H) 65 - 99 mg/dL  Glucose, capillary     Status: Abnormal   Collection Time: 03/22/18  7:24 AM  Result Value Ref Range   Glucose-Capillary 235 (H) 65 - 99 mg/dL  Glucose, capillary     Status: Abnormal   Collection Time: 03/22/18 11:37 AM  Result Value Ref Range   Glucose-Capillary 209 (H) 65 - 99 mg/dL    Current Facility-Administered Medications  Medication Dose Route Frequency Provider Last Rate Last Dose  . acetaminophen (TYLENOL) tablet 650 mg  650 mg Oral Q6H PRN Rise Patience, MD       Or  . acetaminophen (TYLENOL) suppository 650 mg  650 mg Rectal Q6H PRN Rise Patience, MD      . aspirin chewable tablet 81 mg  81 mg Oral Daily Rise Patience, MD      . atorvastatin (LIPITOR) tablet 40 mg  40 mg Oral QHS Rise Patience, MD   40 mg at 03/21/18 2226  . ceFEPIme (MAXIPIME) 1 g in sodium chloride 0.9 % 100 mL IVPB  1 g Intravenous Q12H Delane Ginger, RPH 200 mL/hr at 03/22/18 1122 1 g at 03/22/18 1122  . finasteride (PROSCAR) tablet 5 mg  5 mg Oral Daily Rise Patience, MD      . insulin aspart (novoLOG)  injection 0-9 Units  0-9 Units Subcutaneous TID WC Samuella Cota, MD   3 Units at 03/22/18 1320  . megestrol (MEGACE) 400 MG/10ML suspension 400 mg  400 mg Oral BID Samuella Cota, MD   400 mg at 03/22/18 1321  . metoprolol tartrate (LOPRESSOR) tablet 12.5 mg  12.5 mg Oral BID Rise Patience, MD   12.5 mg at 03/21/18 2226  . ondansetron (ZOFRAN) tablet 4 mg  4 mg Oral Q6H PRN Rise Patience, MD       Or  . ondansetron [facility]) injection 4 mg  4 mg Intravenous Q6H PRN Rise Patience, MD      . sodium chloride flush (NS) 0.9 % injection 10-40 mL  10-40 mL Intracatheter PRN Rise Patience, MD      . terazosin (HYTRIN) capsule 2 mg  2 mg Oral QPM Rise Patience, MD        Musculoskeletal:  Strength & Muscle Tone: within normal limits Gait & Station: normal Patient leans: N/A  Psychiatric Specialty Exam: Physical Exam as per H&P  Review of Systems  Constitutional: Negative.   HENT: Negative.   Eyes: Negative.   Respiratory: Negative.   Cardiovascular: Negative.   Gastrointestinal: Negative.   Genitourinary: Negative.   Musculoskeletal: Negative.   Skin: Negative.   Neurological: Negative.   Endo/Heme/Allergies: Negative.   Psychiatric/Behavioral: Negative.   All other systems reviewed and are negative.    Blood pressure (!) 127/57, pulse 79, temperature 97.6 F (36.4 C), temperature source Oral, resp. rate 20, height 5' 9"  (1.753 m), weight 75.1 kg (165 lb 9.1 oz), SpO2 100 %.Body mass index is 24.45 kg/m.  General Appearance: Casual  Eye Contact:  Good  Speech:  Clear and Coherent  Volume:  Decreased  Mood:  Euthymic  Affect:  Appropriate and Congruent  Thought Process:  Coherent and Goal Directed  Orientation:  Full (Time, Place, and Person)  Thought Content:  Logical  Suicidal Thoughts:  No  Homicidal Thoughts:  No  Memory:  Immediate;   Fair Recent;   Fair Remote;   Fair  Judgement:  Intact  Insight:  Fair  Psychomotor Activity:   Decreased  Concentration:  Concentration: Fair and Attention Span: Fair  Recall:  Good  Fund of Knowledge:  Fair  Language:  Good  Akathisia:  Negative  Handed:  Right  AIMS (if indicated):     Assets:  Communication Skills Desire for Improvement Housing Leisure Time Physical Health Resilience Social Support Talents/Skills Transportation Vocational/Educational  ADL's:  Intact  Cognition:  WNL  Sleep:        Treatment Plan Summary: Daily contact with patient to assess and evaluate symptoms and progress in treatment and Medication management  Disposition: Patient is psychiatrically cleared for discharge when medically stable  No evidence of imminent risk to self or others at present.   Patient does not meet criteria for psychiatric inpatient admission.  Ambrose [place], MD 03/22/2018 2:03 PM

## 2018-03-22 NOTE — Progress Notes (Signed)
Spoke with patient's daughter Krystle Oberman.  She states they do not have any urostomy bags at home.  She indicates that they were never told that the urostomy bags had to be changed.  Will continue to search for appropriate urostomy bag in-house.  Earleen Reaper RN-BC, [employer]

## 2018-03-23 DIAGNOSIS — C259 Malignant neoplasm of pancreas, unspecified: Secondary | ICD-10-CM

## 2018-03-23 DIAGNOSIS — Z9359 Other cystostomy status: Secondary | ICD-10-CM

## 2018-03-23 LAB — URINE CULTURE: Culture: 40000 — AB

## 2018-03-23 LAB — GLUCOSE, CAPILLARY
Glucose-Capillary: 181 mg/dL — ABNORMAL HIGH (ref 65–99)
Glucose-Capillary: 175 mg/dL — ABNORMAL HIGH (ref 65–99)
Glucose-Capillary: 175 mg/dL — ABNORMAL HIGH (ref 65–99)
Glucose-Capillary: 196 mg/dL — ABNORMAL HIGH (ref 65–99)

## 2018-03-23 MED ORDER — HEPARIN SOD (PORK) LOCK FLUSH 100 UNIT/ML IV SOLN
500.0000 [IU] | INTRAVENOUS | Status: AC | PRN
  Administered 2018-03-23: 500 [IU]

## 2018-03-23 MED ORDER — AMPICILLIN 250 MG PO CAPS: 250.0000 mg | ORAL_CAPSULE | Freq: Four times a day (QID) | ORAL | 0 refills | Status: AC

## 2018-03-23 MED ORDER — METFORMIN HCL 500 MG PO TABS: 500.0000 mg | ORAL_TABLET | Freq: Two times a day (BID) | ORAL | Status: DC

## 2018-03-23 MED ORDER — SULFAMETHOXAZOLE-TRIMETHOPRIM 800-160 MG PO TABS: 1.0000 | ORAL_TABLET | Freq: Two times a day (BID) | ORAL | 0 refills | Status: AC

## 2018-03-23 NOTE — Discharge Summary (Addendum)
Physician Discharge Summary  Cabe Lashley ALP:[id number] DOB: March 01, 1941 DOA: 03/20/2018  PCP: System, Pcp Not In  Admit date: 03/20/2018 Discharge date: 03/23/2018  Recommendations for Outpatient Follow-up:    Resolution of UTI.  Borderline prolonged QT, consider repeat EKG as an outpatient.  Pancytopenia secondary to chemotherapy. --Follow-up as an outpatient.  Consider screening for depression.  Follow-up [facility] up in 1 week(s).   Why:  follw up with PC Dr. Kathi Der information: [address]  [place], [place] [address]  [phone] [place] Follow up.   Why:  They will call you to set up visit Contact information: [address]  [place],  [place], [place] [address]  [phone]           Discharge Diagnoses:  1. Complicated UTI, Enterococcus faecalis and Acinetobacter secondary to suprapubic catheter, with associated acute encephalopathy 2. Acute encephalopathy secondary to UTI 3. Pancreatic cancer diagnosed 3/19 4. Pancytopenia secondary to chemotherapy. 5. Diabetes mellitus type 2 6. Dementia per chart  Discharge Condition: improved Disposition: home with daughter  Diet recommendation: diabetic diet  Filed Weights   03/20/18 2000  Weight: 75.1 kg (165 lb 9.1 oz)    History of present illness:  77 year old man PMH pancreatic cancer diagnosed 01/2017, currently undergoing chemotherapy first dose May 8, diabetes mellitus, presented to the emergency department after he was found confused and wandering at his home.  Admitted for complicated UTI without evidence of sepsis, acute kidney injury, acute encephalopathy.  Hospital Course:  Patient was treated with empiric antibiotics with gradual clinical improvement.  Antibiotics were adjusted based on sensitivities.  Hospitalization was uncomplicated.  Daughter requested evaluation by psychiatry which was performed.  Patient with  no acute inpatient needs.  Recommend close outpatient follow-up with his primary care physician for discussion of possible treatment for depression or at least further screening.  Complicated UTI, Enterococcus faecalis and Acinetobacter; suprapubic catheter, with associated acute encephalopathy. --Much improved clinically.  Afebrile.  Plan transition to oral antibiotics.  Given prolonged QT, will treat with ampicillin and Bactrim as recommended by pharmacy.  Acute encephalopathy secondary to UTI.  Apparently recently diagnosed with dementia as well.  CT head negative. --Suspect at baseline at this point.  Pancreatic cancer diagnosed 3/19, currently on chemotherapy.   --Follow-up as an outpatient  Pancytopenia secondary to chemotherapy. --Follow-up as an outpatient.  Diabetes mellitus type 2 --CBG  stable during hospitalization.   Dementia per chart, depression.   Antimicrobials:  Cefepime 5/18 >> 5/20  Ampicillin 5/20 >> 5/25  Bactrim 5/20 >> 5/25   Today's assessment: S: Feels well, no complaints today.  Tolerating diet. O: Vitals:  Vitals:   03/23/18 0654 03/23/18 0958  BP: 126/79 115/67  Pulse: 90 72  Resp: 20 16  Temp: 99 F (37.2 C) (!) 97.5 F (36.4 C)  SpO2: 100% 100%    Constitutional:  . Appears calm and comfortable Respiratory:  . CTA bilaterally, no w/r/r.  . Respiratory effort normal.  Cardiovascular:  . RRR, no m/r/g . No LE extremity edema   Abdomen:  . Soft, nontender.  Suprapubic catheter in place. Musculoskeletal:  . RLE, LLE   o strength and tone normal, no atrophy, no abnormal movements Psychiatric:  . Mental status o Mood, affect appropriate o Confused.  Oriented to self, generic hospital.  EKG independently reviewed shows sinus rhythm, borderline prolonged QT, however interval has  decreased. Urine culture noted. Discharge Instructions  Discharge Instructions    Diet - low sodium heart healthy   Complete by:  As directed     Discharge instructions   Complete by:  As directed    Call your physician or seek immediate medical attention for confusion, falls, weakness, worsening of condition.   Increase activity slowly   Complete by:  As directed      Allergies as of 03/23/2018   No Known Allergies     Medication List    TAKE these medications   ampicillin 250 MG capsule Commonly known as:  PRINCIPEN Take 1 capsule (250 mg total) by mouth 4 (four) times daily.   insulin aspart 100 UNIT/ML injection Commonly known as:  novoLOG Inject 5 Units into the skin See admin instructions. Three times a day if blood sugar is over 110 prior to meals   MEGACE ORAL 40 MG/ML suspension Generic drug:  megestrol Take 400 mg by mouth 2 (two) times daily. 10 ML   sulfamethoxazole-trimethoprim 800-160 MG tablet Commonly known as:  BACTRIM DS,SEPTRA DS Take 1 tablet by mouth 2 (two) times daily.      No Known Allergies  The results of significant diagnostics from this hospitalization (including imaging, microbiology, ancillary and laboratory) are listed below for reference.    Significant Diagnostic Studies: Ct Head Wo Contrast  Result Date: 03/21/2018 CLINICAL DATA:  Acute onset of altered mental status. EXAM: CT HEAD WITHOUT CONTRAST TECHNIQUE: Contiguous axial images were obtained from the base of the skull through the vertex without intravenous contrast. COMPARISON:  MRI of the brain performed 02/15/2018 FINDINGS: Brain: No evidence of acute infarction, hemorrhage, hydrocephalus, extra-axial collection or mass lesion / mass effect. Prominence of the ventricles and sulci reflects mild cortical volume loss. Diffuse periventricular and subcortical white matter change reflects small vessel ischemic microangiopathy, as noted on recent MRI. 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 The brainstem and fourth ventricle are within normal limits. The  basal ganglia are unremarkable in appearance. No mass effect or midline shift is seen. Vascular: No hyperdense vessel or unexpected calcification. Skull: There is no evidence of fracture; visualized osseous structures are unremarkable in appearance. Sinuses/Orbits: The orbits are within normal limits. The paranasal sinuses and mastoid air cells are well-aerated. Other: No significant soft tissue abnormalities are seen. IMPRESSION: 1. No acute intracranial pathology seen on CT. 2. Mild cortical volume loss and diffuse small vessel ischemic microangiopathy. 3. 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 Electronically Signed   By: Garald Balding M.D.   On: 03/21/2018 02:10   US Renal  Result Date: 03/21/2018 CLINICAL DATA:  Acute onset of renal failure. EXAM: RENAL / URINARY TRACT ULTRASOUND COMPLETE COMPARISON:  Renal ultrasound performed 02/16/2018 FINDINGS: Right Kidney: Length: 10.0 cm. Mildly increased parenchymal echogenicity is noted. No mass or hydronephrosis visualized. Left Kidney: Length: 11.1 cm. Mildly increased parenchymal echogenicity is noted. No mass or hydronephrosis visualized. Bladder: Decompressed, with a suprapubic catheter in place. IMPRESSION: 1. No evidence of hydronephrosis. 2. Mildly increased renal parenchymal echogenicity raises concern for medical renal disease. Electronically Signed   By: Garald Balding M.D.   On: 03/21/2018 06:16   Dg Chest Port 1 View  Result Date: 03/20/2018 CLINICAL DATA:  Altered mental status, sepsis. History of pancreatic cancer, on chemotherapy. EXAM: PORTABLE CHEST 1 VIEW COMPARISON:  Chest radiograph February 15, 2018  FINDINGS: Cardiomediastinal silhouette is normal. No pleural effusions or focal consolidations. Trachea projects midline and there is no pneumothorax. Soft tissue planes and included osseous structures are non-suspicious. Single lumen RIGHT chest Port-A-Cath distal tip  projects in distal superior vena cava. IMPRESSION: No acute cardiopulmonary process. Electronically Signed   By: Elon Alas M.D.   On: 03/20/2018 20:33    Microbiology: Recent Results (from the past 240 hour(s))  Blood Culture (routine x 2)     Status: None (Preliminary result)   Collection Time: 03/20/18  8:00 PM  Result Value Ref Range Status   Specimen Description BLOOD RIGHT ANTECUBITAL  Final   Special Requests   Final    BOTTLES DRAWN AEROBIC AND ANAEROBIC Blood Culture adequate volume   Culture   Final    NO GROWTH 2 DAYS Performed at [facility], [address], [place], [place] [address]    Report Status PENDING  Incomplete  Blood Culture (routine x 2)     Status: None (Preliminary result)   Collection Time: 03/20/18  8:24 PM  Result Value Ref Range Status   Specimen Description BLOOD BLOOD RIGHT FOREARM  Final   Special Requests   Final    BOTTLES DRAWN AEROBIC AND ANAEROBIC Blood Culture adequate volume   Culture   Final    NO GROWTH 2 DAYS Performed at [facility], [place] [address] [place],  [address]    Report Status PENDING  Incomplete  Urine culture     Status: Abnormal   Collection Time: 03/20/18  8:45 PM  Result Value Ref Range Status   Specimen Description URINE, SUPRAPUBIC  Final   Special Requests NONE  Final   Culture (A)  Final    40,000 COLONIES/mL ENTEROCOCCUS FAECALIS >=100,000 COLONIES/mL ACINETOBACTER CALCOACETICUS/BAUMANNII COMPLEX    Report Status 03/23/2018 FINAL  Final   Organism ID, Bacteria ENTEROCOCCUS FAECALIS (A)  Final   Organism ID, Bacteria ACINETOBACTER CALCOACETICUS/BAUMANNII COMPLEX (A)  Final      Susceptibility   Acinetobacter calcoaceticus/baumannii complex - MIC*    CEFTAZIDIME 4 SENSITIVE Sensitive     CEFTRIAXONE 16 INTERMEDIATE Intermediate     CIPROFLOXACIN <=0.25 SENSITIVE Sensitive     GENTAMICIN <=1 SENSITIVE Sensitive     IMIPENEM <=0.25 SENSITIVE Sensitive     PIP/TAZO <=4 SENSITIVE  Sensitive     TRIMETH/SULFA <=20 SENSITIVE Sensitive     CEFEPIME 2 SENSITIVE Sensitive     AMPICILLIN/SULBACTAM <=2 SENSITIVE Sensitive     * >=100,000 COLONIES/mL ACINETOBACTER CALCOACETICUS/BAUMANNII COMPLEX   Enterococcus faecalis - MIC*    AMPICILLIN <=2 SENSITIVE Sensitive     LEVOFLOXACIN 1 SENSITIVE Sensitive     NITROFURANTOIN <=16 SENSITIVE Sensitive     VANCOMYCIN 1 SENSITIVE Sensitive     * 40,000 COLONIES/mL ENTEROCOCCUS FAECALIS     Labs: Basic Metabolic Panel: Recent Labs  Lab 03/20/18 1954 03/21/18 0129  NA 133* 136  K 5.1 4.0  CL 100* 104  CO2 17* 21*  GLUCOSE 355* 268*  BUN 21* 16  CREATININE 1.47* 0.95  CALCIUM 9.3 8.5*   Liver Function Tests: Recent Labs  Lab 03/20/18 1954 03/21/18 0129  AST 18 14*  ALT 12* 11*  ALKPHOS 51 42  BILITOT 1.5* 1.3*  PROT 7.3 6.3*  ALBUMIN 3.6 3.1*   CBC: Recent Labs  Lab 03/20/18 1954 03/21/18 0129  WBC 4.2 3.8*  NEUTROABS 2.7 2.1  HGB 10.6* 9.7*  HCT 32.2* 29.1*  MCV 89.4 89.0  PLT 150 135*  CBG: Recent Labs  Lab 03/22/18 1637 03/22/18 2128 03/22/18 2318 03/23/18 0518 03/23/18 0836  GLUCAP 204* 146* 172* 181* 175*    Principal Problem:   Complicated UTI (urinary tract infection) Active Problems:   Acute encephalopathy   Uncontrolled type 2 diabetes mellitus with hyperglycemia (HCC)   Pancreatic cancer (HCC)   AKI (acute kidney injury) ([place])   Pancytopenia ([place])   Suprapubic catheter ([place])   Goals of care, counseling/discussion   Palliative care by specialist   Time coordinating discharge: 35 minutes  Signed:  Murray Hodgkins, MD [facility] 03/23/2018, 11:36 AM

## 2018-03-23 NOTE — Care Management Note (Addendum)
Case Management Note  Patient Details  Name: Elijah Castro MRN: [id number] Date of Birth: 1941-02-04  Subjective/Objective:  Admitted for complicated UTI without evidence of sepsis, acute kidney injury, acute encephalopathy. PCP is Dr. Candiss Norse               Action/Plan: Patient son reported patient has been staying with his sister in [place] and then he has a plans to go back and stay with his sister Otila Kluver in [place] after the hospitalization. Spoke April Alexander with [facility] patient coordinator, states she will notify PCP of admission.  PCP is at [facility]: [phone]; social worker at [place]: Johnanna Schneiders [phone], ext [phone].   Discussed physical therapy recommendation of home health therapy with daughter "tina" also verified address/contact information for discharge. Daughter requested [facility] in [place] [phone].  Referral for home health PT/RN/aide/social worker sent via fax to [place] with [facility] in [place] at [phone] for review (H & P, face sheet and orders).  Home Equipment: Kasandra Knudsen - single point;Walker - 4 wheels;Shower seat.  Expected Discharge Date:     03/23/18             Expected Discharge Plan:   Home with Home Health  In-House Referral:   N/A  Discharge planning Services    CM consult Post Acute Care Choice:   Home Health Choice offered to:    Daughter Otila Kluver DME Arranged:    DME Agency:     HH Arranged:    [place]:   [place]  Status of Service:     If discussed at Long Length of Stay Meetings, dates discussed:    Additional Comments:  Kristen Cardinal, RN 03/23/2018, 10:14 AM

## 2018-03-23 NOTE — Progress Notes (Signed)
Inpatient Diabetes Program Recommendations  AACE/ADA: New Consensus Statement on Inpatient Glycemic Control (2019)  Target Ranges:  Prepandial:   less than 140 mg/dL      Peak postprandial:   less than 180 mg/dL (1-2 hours)      Critically ill patients:  140 - 180 mg/dL   Results for Elijah Castro, Elijah Castro (MRN [id number]) as of 03/23/2018 09:19  Ref. Range 03/22/2018 07:24 03/22/2018 11:37 03/22/2018 16:37 03/22/2018 21:28 03/22/2018 23:18 03/23/2018 05:18 03/23/2018 08:36  Glucose-Capillary Latest Ref Range: 65 - 99 mg/dL 235 (H) 209 (H) 204 (H) 146 (H) 172 (H) 181 (H) 175 (H)   Review of Glycemic Control  Diabetes history: DM2 Outpatient Diabetes medications: Novolog 5 units TID if CBG is over 110 mg/dl prior to meals Current orders for Inpatient glycemic control: Novolog 0-9 units TID with meals  Inpatient Diabetes Program Recommendations:  Correction (SSI): Please consider ordering Novolog 0-5 units QHS for bedtime correction. Insulin - Meal Coverage: Please consider ordering Novolog 3 units TID with meals for meal coverage if patient eats at least 50% of meals.  Thanks, Barnie Alderman, RN, MSN, CDE Diabetes Coordinator Inpatient Diabetes Program [phone] (Team Pager from 8am to 5pm)

## 2018-03-23 NOTE — Progress Notes (Signed)
NCM spoke with [facility] regarding Home health services for patient: [employer], [employer].  Per [facility] will have provider approve authorization for home health with [facility] in [place].  Discharge summary sent to [facility] via fax;

## 2018-03-25 LAB — CULTURE, BLOOD (ROUTINE X 2)
CULTURE: NO GROWTH
Culture: NO GROWTH
SPECIAL REQUESTS: ADEQUATE
SPECIAL REQUESTS: ADEQUATE

## 2018-06-04 DEATH — deceased

## 2019-12-04 IMAGING — DX DG SKULL 1-3V
3 series · 3 of 3 positions shown · non-contrast
Comparison: None

CLINICAL DATA: MRI clearance

EXAM:
SKULL - 1-3 VIEW

[skull calldwell (1 of 2)]
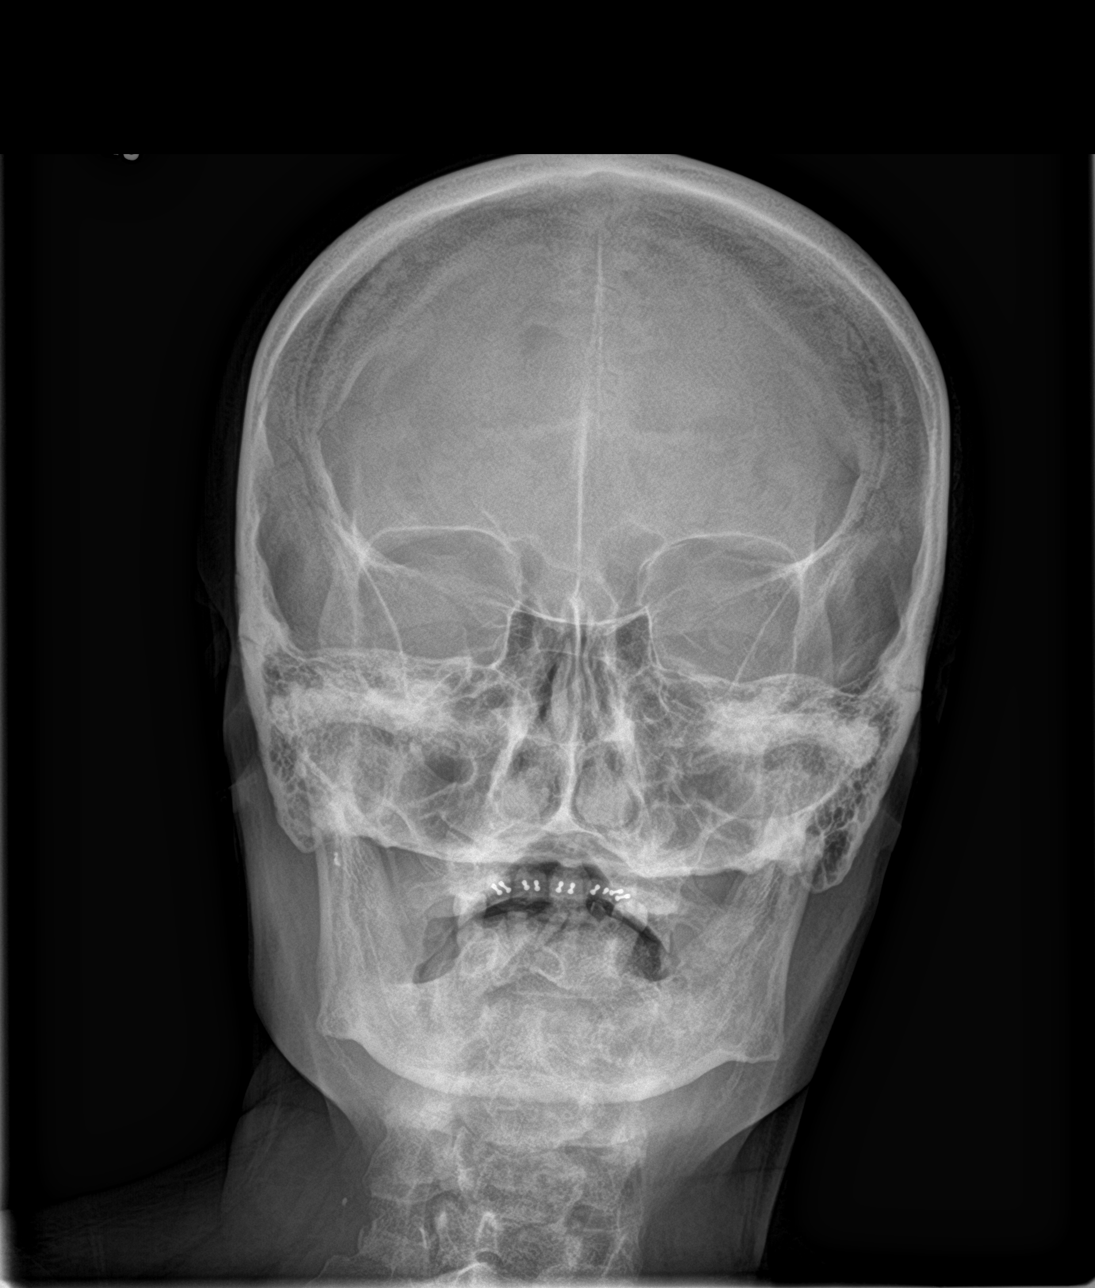

[skull lat]
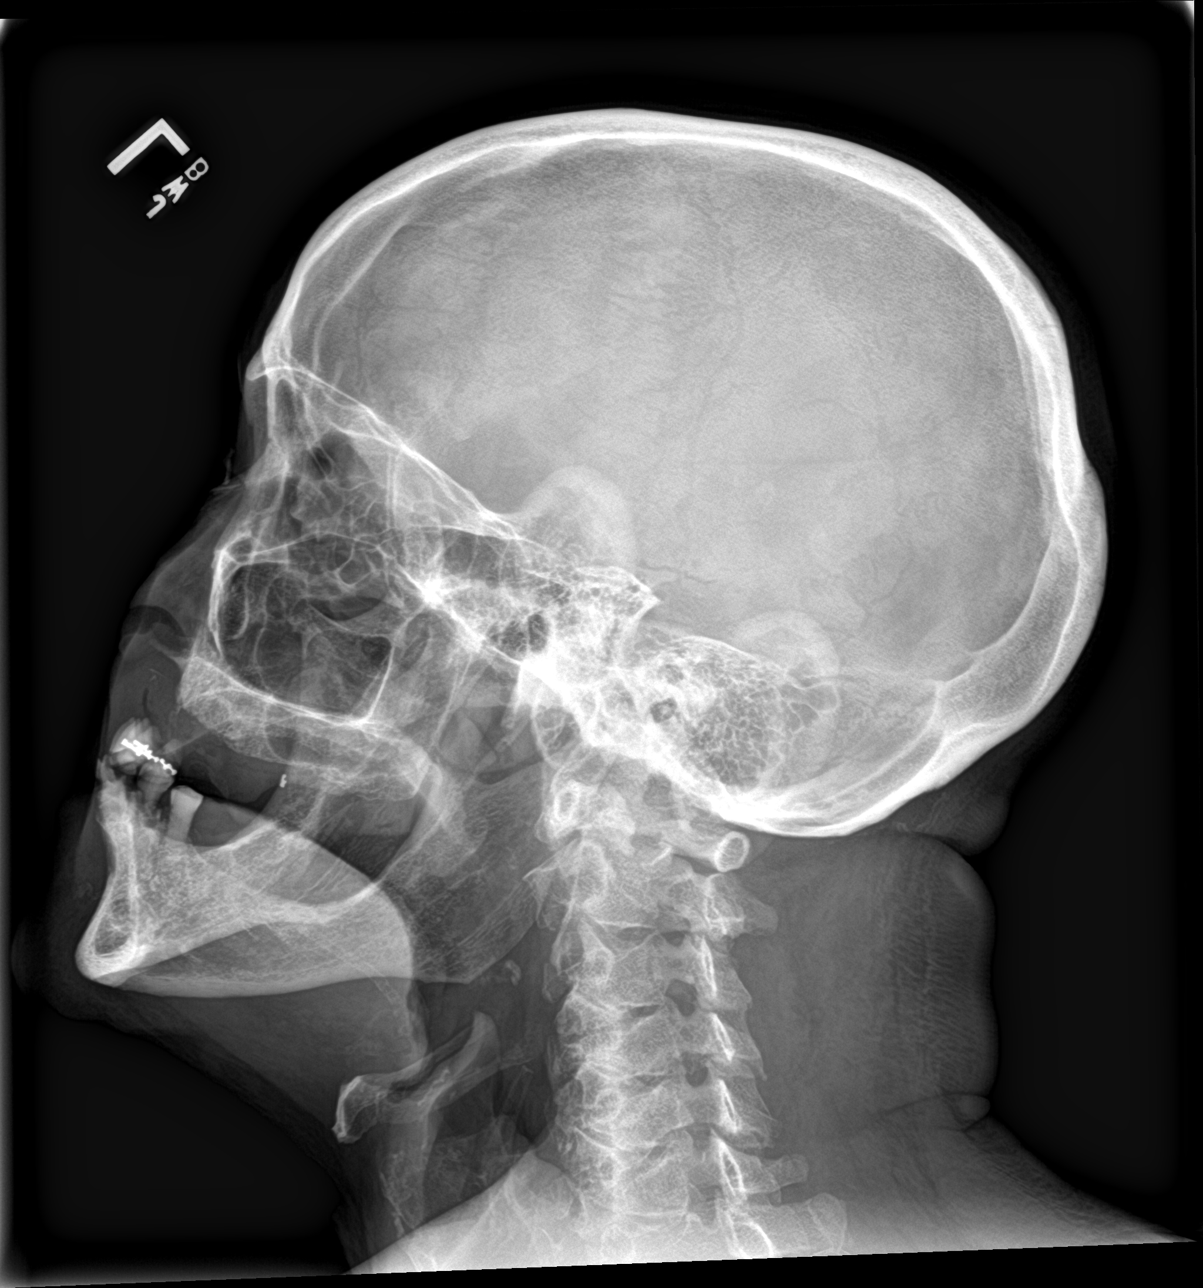

[skull calldwell (2 of 2)]
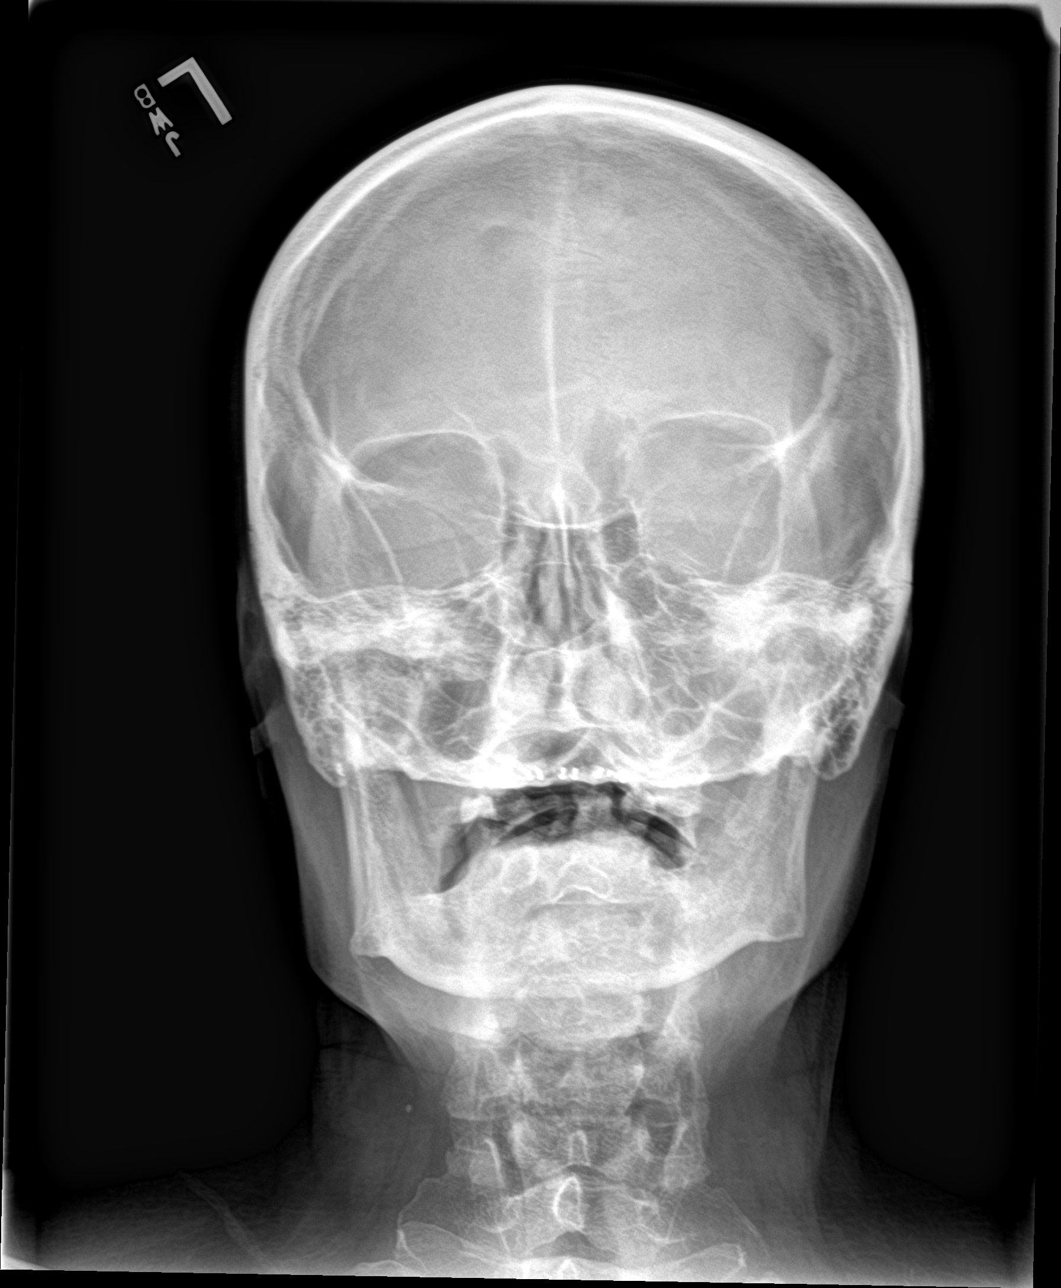

[3 of 3 positions shown; findings below may reference images not displayed]

FINDINGS: Metallic dental appliances.

Additional small metallic foreign body at RIGHT mandible.

No metallic foreign bodies project over the orbits or skull.

Visualized paranasal sinuses clear.

No acute osseous findings.
IMPRESSION: No metallic foreign bodies project over the orbits or skull.

## 2019-12-06 IMAGING — CT CT IMAGE GUIDED DRAINAGE BY PERCUTANEOUS CATHETER
1 of 3 series · 13 of 32 positions shown, 18 images · non-contrast
Comparison: Renal ultrasound 02/16/2018

INDICATION: 76-year-old male with bladder outlet obstruction secondary to a
urethral stricture. Recent unsuccessful attempt at Foley catheter
placement. Patient presents at the request of urology for suprapubic
catheter placement.

EXAM:
CT-guided placement of a suprapubic catheter

[Series 4: i-spiral 5.0 b40f · axial · 0.74mm/px · z∈[+758,+972]mm · 13 of 71 slices shown, 18 images]
[im 5/71  soft-tissue]
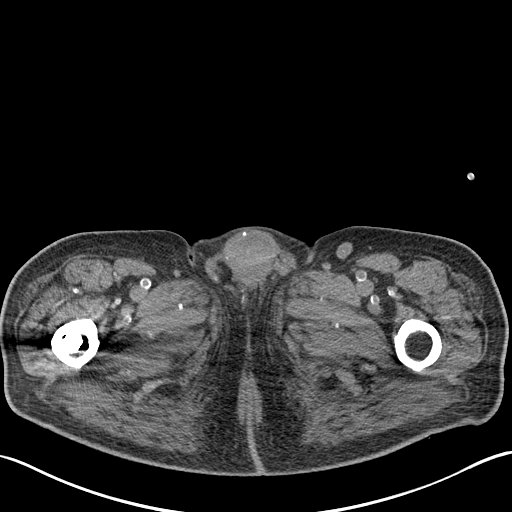
[im 5/71  bone]
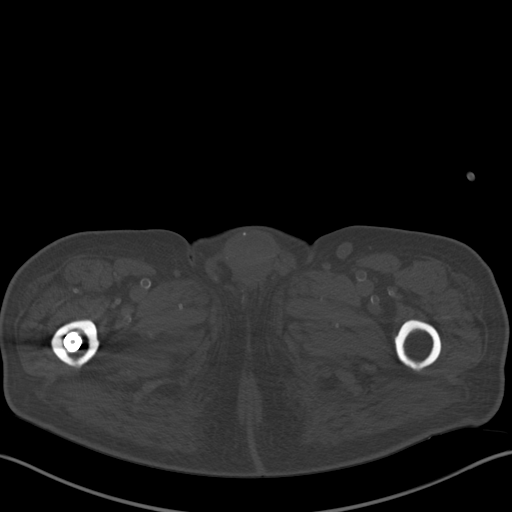
[im 10/71  soft-tissue]
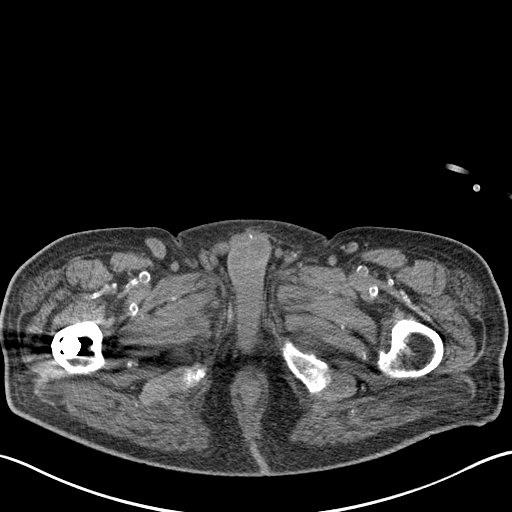
[im 15/71  soft-tissue]
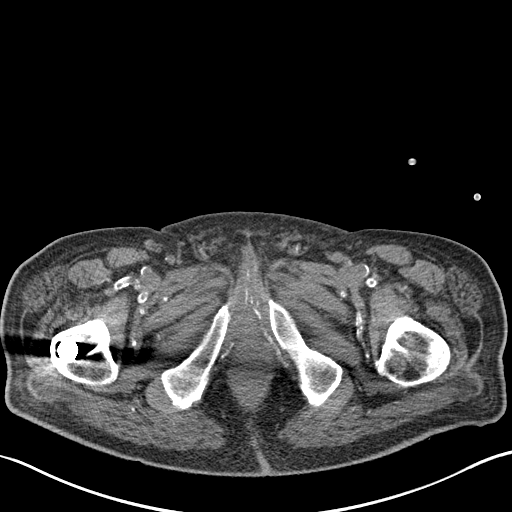
[im 24/71  soft-tissue]
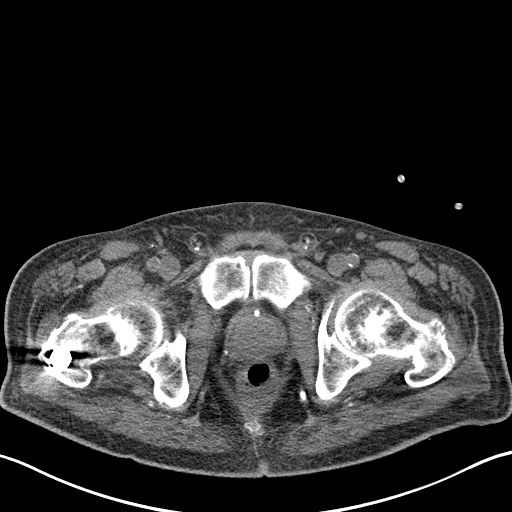
[im 29/71  soft-tissue]
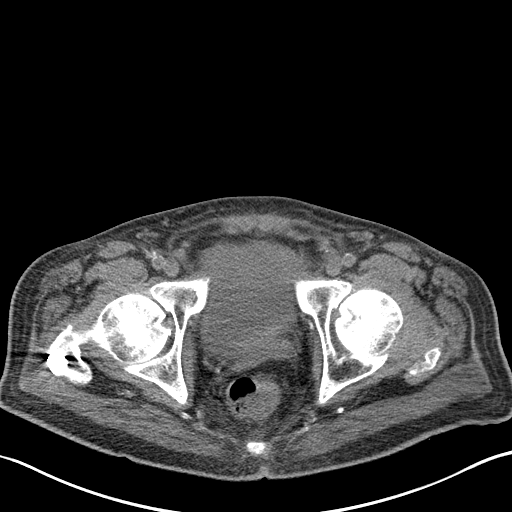
[im 33/71  soft-tissue]
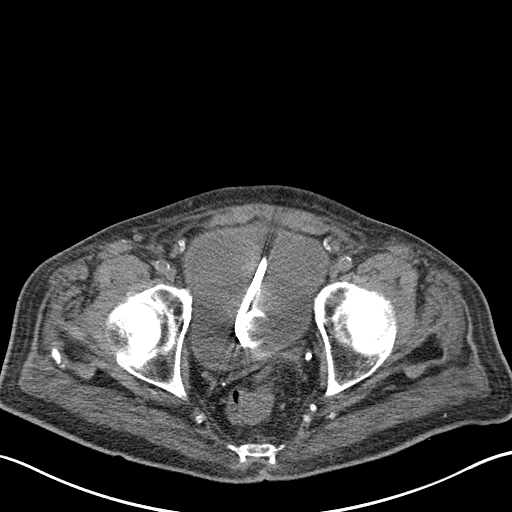
[im 38/71  soft-tissue]
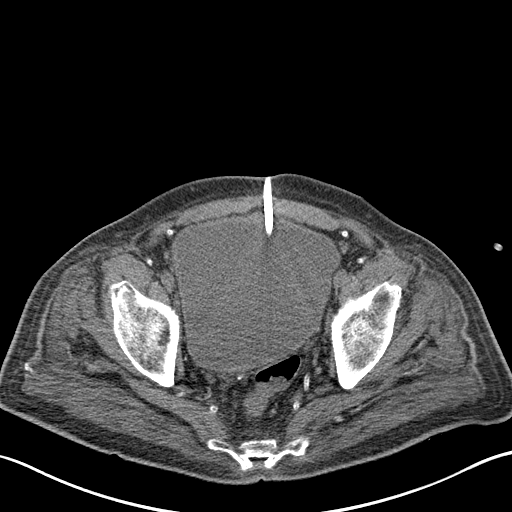
[im 43/71  soft-tissue]
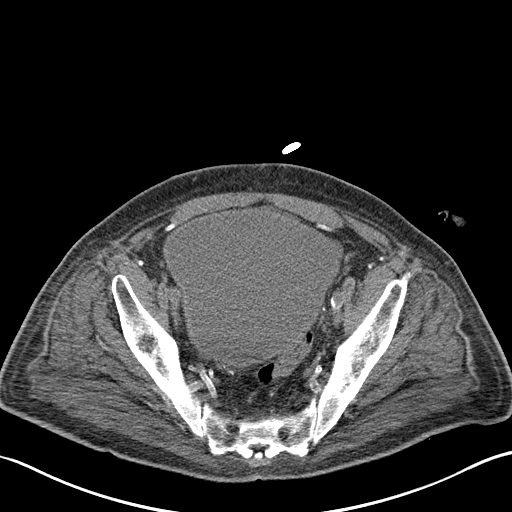
[im 47/71  soft-tissue]
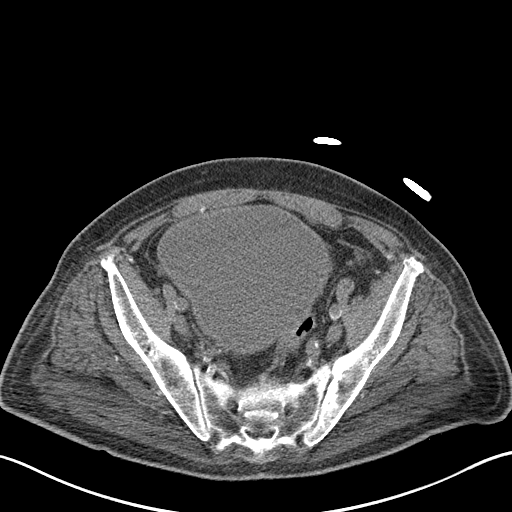
[im 47/71  bone]
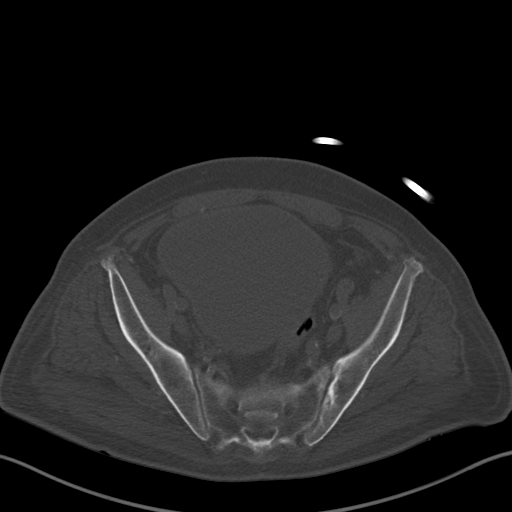
[im 52/71  lung]
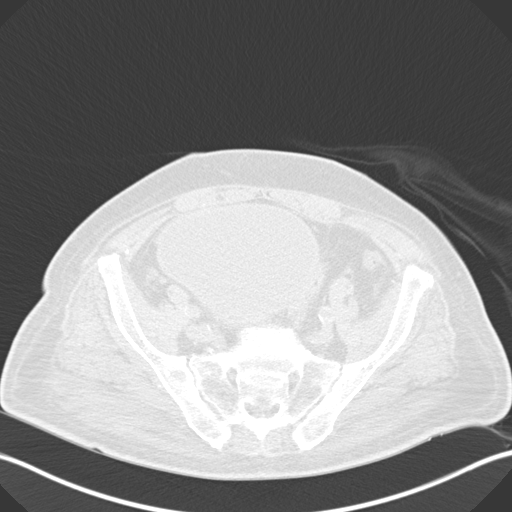
[im 57/71  soft-tissue]
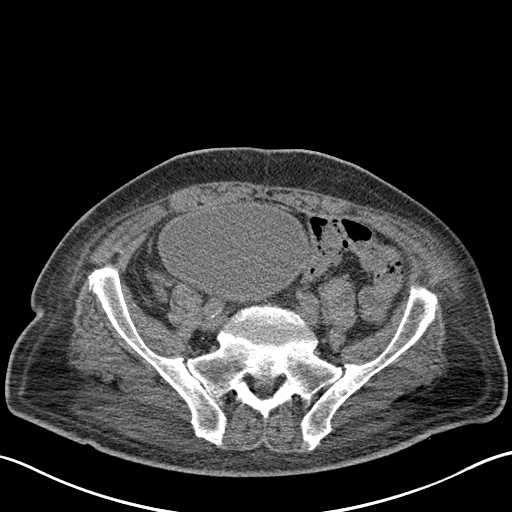
[im 57/71  lung]
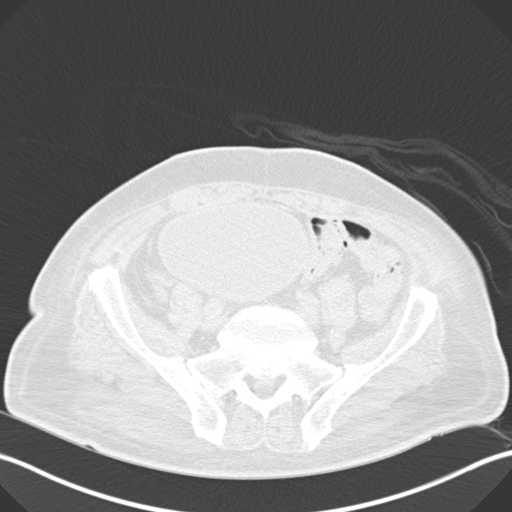
[im 61/71  soft-tissue]
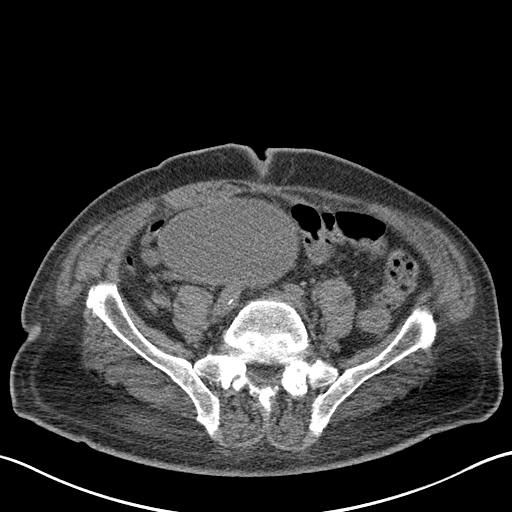
[im 61/71  lung]
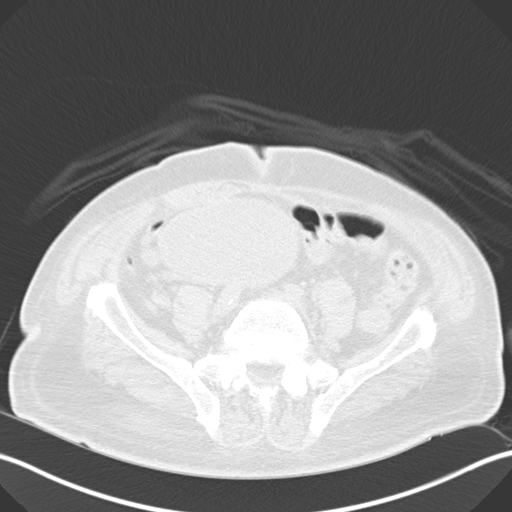
[im 66/71  soft-tissue]
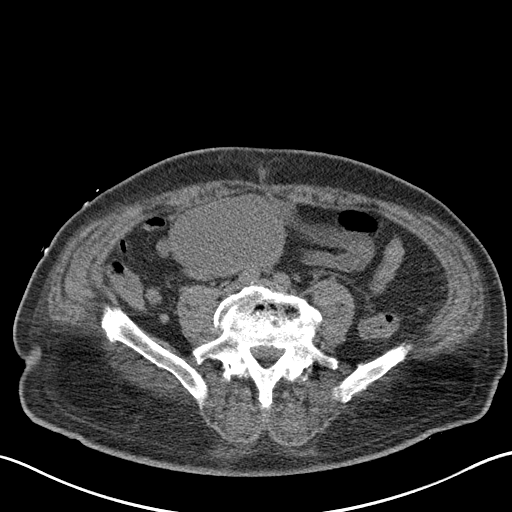
[im 66/71  lung]
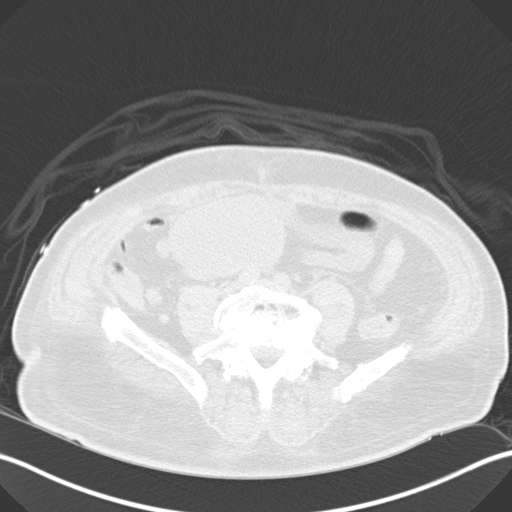

[13 of 32 positions shown; findings below may reference images not displayed]

MEDICATIONS:
2 g Ancef; The antibiotic was administered in an appropriate time
frame prior to skin puncture.

ANESTHESIA/SEDATION:
Fentanyl 50 mcg IV; Versed 1 mg IV

Moderate Sedation Time:  21 minutes

The patient was continuously monitored during the procedure by the
interventional radiology nurse under my direct supervision.

CONTRAST:  None

FLUOROSCOPY TIME:  Fluoroscopy Time: 0 minutes 0 seconds (0 mGy).

COMPLICATIONS:
None immediate.

PROCEDURE:
Informed written consent was obtained from the patient after a
thorough discussion of the procedural risks, benefits and
alternatives. All questions were addressed. Maximal Sterile Barrier
Technique was utilized including caps, mask, sterile gowns, sterile
gloves, sterile drape, hand hygiene and skin antiseptic. A timeout
was performed prior to the initiation of the procedure.

A planning axial CT scan was performed. The distended bladder is
successfully identified. A suitable spot in the midline low anterior
abdominal wall was selected and the overlying skin marked. Following
sterile prep and drape in the standard fashion with chlorhexidine
skin prep, local anesthesia was attained by infiltration with 1%
lidocaine. A small dermatotomy was made. Using intermittent CT
guidance, an 18 gauge trocar needle was advanced through the median
raphe between the rectus abdominus muscles and into the bladder.
Removal of the stylet demonstrates release of clear yellow urine. A
0.035 wire was advanced to the needle and coiled in the bladder.
Skin tract was then dilated to 14 French and a flexible 14 French
all-purpose drainage catheter was advanced over the wire and formed
in the bladder. The catheter was connected to gravity bag drainage
and sutured to the skin with 0 Prolene suture. A follow-up axial CT
scan demonstrates a well-positioned tube and no evidence of acute
complication.
IMPRESSION: Successful placement of a 14 French suprapubic catheter.

PLAN:
[REDACTED] in 6 weeks for catheter exchange
and up size to a 16 French Foley catheter.
